# Patient Record
Sex: Male | Born: 1975 | Race: White | Hispanic: No | Marital: Single | Smoking: Current every day smoker
Health system: Southern US, Community
[De-identification: ages and names within clinical notes are randomized; demographics above are authoritative.]

## PROBLEM LIST (undated history)

## (undated) DIAGNOSIS — I10 Essential (primary) hypertension: Secondary | ICD-10-CM

---

## 2009-08-26 ENCOUNTER — Emergency Department (HOSPITAL_COMMUNITY): Admission: EM | Admit: 2009-08-26 | Discharge: 2009-08-26 | Payer: Self-pay | Admitting: Emergency Medicine

## 2010-01-08 ENCOUNTER — Emergency Department (HOSPITAL_COMMUNITY): Admission: EM | Admit: 2010-01-08 | Discharge: 2010-01-08 | Payer: Self-pay | Admitting: Emergency Medicine

## 2013-10-07 ENCOUNTER — Emergency Department (HOSPITAL_COMMUNITY)
Admission: EM | Admit: 2013-10-07 | Discharge: 2013-10-07 | Disposition: A | Discharge status: Home / Self Care | Payer: Self-pay | Attending: Emergency Medicine | Admitting: Emergency Medicine

## 2013-10-07 ENCOUNTER — Encounter (HOSPITAL_COMMUNITY): Payer: Self-pay | Admitting: Emergency Medicine

## 2013-10-07 DIAGNOSIS — F17200 Nicotine dependence, unspecified, uncomplicated: Secondary | ICD-10-CM | POA: Insufficient documentation

## 2013-10-07 DIAGNOSIS — R112 Nausea with vomiting, unspecified: Secondary | ICD-10-CM | POA: Insufficient documentation

## 2013-10-07 DIAGNOSIS — R197 Diarrhea, unspecified: Secondary | ICD-10-CM | POA: Insufficient documentation

## 2013-10-07 DIAGNOSIS — R748 Abnormal levels of other serum enzymes: Secondary | ICD-10-CM | POA: Insufficient documentation

## 2013-10-07 DIAGNOSIS — I10 Essential (primary) hypertension: Secondary | ICD-10-CM | POA: Insufficient documentation

## 2013-10-07 DIAGNOSIS — R945 Abnormal results of liver function studies: Secondary | ICD-10-CM

## 2013-10-07 DIAGNOSIS — R7989 Other specified abnormal findings of blood chemistry: Secondary | ICD-10-CM | POA: Insufficient documentation

## 2013-10-07 DIAGNOSIS — Z79899 Other long term (current) drug therapy: Secondary | ICD-10-CM | POA: Insufficient documentation

## 2013-10-07 DIAGNOSIS — R7309 Other abnormal glucose: Secondary | ICD-10-CM | POA: Insufficient documentation

## 2013-10-07 HISTORY — DX: Essential (primary) hypertension: I10

## 2013-10-07 LAB — CBC WITH DIFFERENTIAL/PLATELET
BASOS PCT: 0 % (ref 0–1)
Basophils Absolute: 0 10*3/uL (ref 0.0–0.1)
Eosinophils Absolute: 0.2 10*3/uL (ref 0.0–0.7)
Eosinophils Relative: 5 % (ref 0–5)
HEMATOCRIT: 42.8 % (ref 39.0–52.0)
Hemoglobin: 15.2 g/dL (ref 13.0–17.0)
LYMPHS PCT: 23 % (ref 12–46)
Lymphs Abs: 1.1 10*3/uL (ref 0.7–4.0)
MCH: 32.8 pg (ref 26.0–34.0)
MCHC: 35.5 g/dL (ref 30.0–36.0)
MCV: 92.4 fL (ref 78.0–100.0)
MONOS PCT: 8 % (ref 3–12)
Monocytes Absolute: 0.4 10*3/uL (ref 0.1–1.0)
NEUTROS ABS: 3 10*3/uL (ref 1.7–7.7)
NEUTROS PCT: 64 % (ref 43–77)
Platelets: 157 10*3/uL (ref 150–400)
RBC: 4.63 MIL/uL (ref 4.22–5.81)
RDW: 12.7 % (ref 11.5–15.5)
WBC: 4.8 10*3/uL (ref 4.0–10.5)

## 2013-10-07 LAB — COMPREHENSIVE METABOLIC PANEL
ALBUMIN: 3.8 g/dL (ref 3.5–5.2)
ALK PHOS: 62 U/L (ref 39–117)
ALT: 62 U/L — ABNORMAL HIGH (ref 0–53)
AST: 156 U/L — AB (ref 0–37)
Anion gap: 11 (ref 5–15)
BILIRUBIN TOTAL: 1.1 mg/dL (ref 0.3–1.2)
BUN: 10 mg/dL (ref 6–23)
CO2: 29 meq/L (ref 19–32)
Calcium: 8.9 mg/dL (ref 8.4–10.5)
Chloride: 100 mEq/L (ref 96–112)
Creatinine, Ser: 0.85 mg/dL (ref 0.50–1.35)
GFR calc Af Amer: >90 mL/min (ref >90)
Glucose, Bld: 221 mg/dL — ABNORMAL HIGH (ref 70–99)
POTASSIUM: 3.7 meq/L (ref 3.7–5.3)
Sodium: 140 mEq/L (ref 137–147)
Total Protein: 6.5 g/dL (ref 6.0–8.3)

## 2013-10-07 LAB — LIPASE, BLOOD: Lipase: 25 U/L (ref 11–59)

## 2013-10-07 MED ORDER — TRAMADOL HCL 50 MG PO TABS: 50.0000 mg | ORAL_TABLET | Freq: Four times a day (QID) | ORAL | Status: DC | PRN

## 2013-10-07 MED ORDER — SODIUM CHLORIDE 0.9 % IV BOLUS (SEPSIS)
1000.0000 mL | Freq: Once | INTRAVENOUS | Status: AC
  Administered 2013-10-07: 1000 mL via INTRAVENOUS

## 2013-10-07 MED ORDER — PROMETHAZINE HCL 25 MG PO TABS: 25.0000 mg | ORAL_TABLET | Freq: Four times a day (QID) | ORAL | Status: DC | PRN

## 2013-10-07 MED ORDER — ONDANSETRON HCL 4 MG/2ML IJ SOLN
4.0000 mg | Freq: Once | INTRAMUSCULAR | Status: AC
  Administered 2013-10-07: 4 mg via INTRAVENOUS

## 2013-10-07 MED ORDER — ONDANSETRON HCL 4 MG/2ML IJ SOLN
4.0000 mg | Freq: Once | INTRAMUSCULAR | Status: DC
  Filled 2013-10-07: qty 2

## 2013-10-07 NOTE — ED Notes (Signed)
To ED via private vehicle with c/o nausea/vomiting/diarrhea that started on Friday evening. Unable to keep anything down, states is throwing up greenish liquid at present

## 2013-10-07 NOTE — Discharge Instructions (Signed)
Viral Gastroenteritis °Viral gastroenteritis is also known as stomach flu. This condition affects the stomach and intestinal tract. It can cause sudden diarrhea and vomiting. The illness typically lasts 3 to 8 days. Most people develop an immune response that eventually gets rid of the virus. While this natural response develops, the virus can make you quite ill. °CAUSES  °Many different viruses can cause gastroenteritis, such as rotavirus or noroviruses. You can catch one of these viruses by consuming contaminated food or water. You may also catch a virus by sharing utensils or other personal items with an infected person or by touching a contaminated surface. °SYMPTOMS  °The most common symptoms are diarrhea and vomiting. These problems can cause a severe loss of body fluids (dehydration) and a body salt (electrolyte) imbalance. Other symptoms may include: °· Fever. °· Headache. °· Fatigue. °· Abdominal pain. °DIAGNOSIS  °Your caregiver can usually diagnose viral gastroenteritis based on your symptoms and a physical exam. A stool sample may also be taken to test for the presence of viruses or other infections. °TREATMENT  °This illness typically goes away on its own. Treatments are aimed at rehydration. The most serious cases of viral gastroenteritis involve vomiting so severely that you are not able to keep fluids down. In these cases, fluids must be given through an intravenous line (IV). °HOME CARE INSTRUCTIONS  °· Drink enough fluids to keep your urine clear or pale yellow. Drink small amounts of fluids frequently and increase the amounts as tolerated. °· Ask your caregiver for specific rehydration instructions. °· Avoid: °¨ Foods high in sugar. °¨ Alcohol. °¨ Carbonated drinks. °¨ Tobacco. °¨ Juice. °¨ Caffeine drinks. °¨ Extremely hot or cold fluids. °¨ Fatty, greasy foods. °¨ Too much intake of anything at one time. °¨ Dairy products until 24 to 48 hours after diarrhea stops. °· You may consume probiotics.  Probiotics are active cultures of beneficial bacteria. They may lessen the amount and number of diarrheal stools in adults. Probiotics can be found in yogurt with active cultures and in supplements. °· Wash your hands well to avoid spreading the virus. °· Only take over-the-counter or prescription medicines for pain, discomfort, or fever as directed by your caregiver. Do not give aspirin to children. Antidiarrheal medicines are not recommended. °· Ask your caregiver if you should continue to take your regular prescribed and over-the-counter medicines. °· Keep all follow-up appointments as directed by your caregiver. °SEEK IMMEDIATE MEDICAL CARE IF:  °· You are unable to keep fluids down. °· You do not urinate at least once every 6 to 8 hours. °· You develop shortness of breath. °· You notice blood in your stool or vomit. This may look like coffee grounds. °· You have abdominal pain that increases or is concentrated in one small area (localized). °· You have persistent vomiting or diarrhea. °· You have a fever. °· The patient is a child younger than 3 months, and he or she has a fever. °· The patient is a child older than 3 months, and he or she has a fever and persistent symptoms. °· The patient is a child older than 3 months, and he or she has a fever and symptoms suddenly get worse. °· The patient is a baby, and he or she has no tears when crying. °MAKE SURE YOU:  °· Understand these instructions. °· Will watch your condition. °· Will get help right away if you are not doing well or get worse. °Document Released: 02/15/2005 Document Revised: 05/10/2011 Document Reviewed: 12/02/2010 °  [employer] Patient Information 2015 [place], [place]. This information is not intended to replace advice given to you by your health care provider. Make sure you discuss any questions you have with your health care provider. Hyperglycemia Hyperglycemia occurs when the glucose (sugar) in your blood is too high. Hyperglycemia can happen  for many reasons, but it most often happens to people who do not know they have diabetes or are not managing their diabetes properly.  CAUSES  Whether you have diabetes or not, there are other causes of hyperglycemia. Hyperglycemia can occur when you have diabetes, but it can also occur in other situations that you might not be as aware of, such as: Diabetes  If you have diabetes and are having problems controlling your blood glucose, hyperglycemia could occur because of some of the following reasons:  Not following your meal plan.  Not taking your diabetes medications or not taking it properly.  Exercising less or doing less activity than you normally do.  Being sick. Pre-diabetes  This cannot be ignored. Before people develop Type 2 diabetes, they almost always have "pre-diabetes." This is when your blood glucose levels are higher than normal, but not yet high enough to be diagnosed as diabetes. Research has shown that some long-term damage to the body, especially the heart and circulatory system, may already be occurring during pre-diabetes. If you take action to manage your blood glucose when you have pre-diabetes, you may delay or prevent Type 2 diabetes from developing. Stress  If you have diabetes, you may be "diet" controlled or on oral medications or insulin to control your diabetes. However, you may find that your blood glucose is higher than usual in the hospital whether you have diabetes or not. This is often referred to as "stress hyperglycemia." Stress can elevate your blood glucose. This happens because of hormones put out by the body during times of stress. If stress has been the cause of your high blood glucose, it can be followed regularly by your caregiver. That way he/she can make sure your hyperglycemia does not continue to get worse or progress to diabetes. Steroids  Steroids are medications that act on the infection fighting system (immune system) to block inflammation or  infection. One side effect can be a rise in blood glucose. Most people can produce enough extra insulin to allow for this rise, but for those who cannot, steroids make blood glucose levels go even higher. It is not unusual for steroid treatments to "uncover" diabetes that is developing. It is not always possible to determine if the hyperglycemia will go away after the steroids are stopped. A special blood test called an A1c is sometimes done to determine if your blood glucose was elevated before the steroids were started. SYMPTOMS  Thirsty.  Frequent urination.  Dry mouth.  Blurred vision.  Tired or fatigue.  Weakness.  Sleepy.  Tingling in feet or leg. DIAGNOSIS  Diagnosis is made by monitoring blood glucose in one or all of the following ways:  A1c test. This is a chemical found in your blood.  Fingerstick blood glucose monitoring.  Laboratory results. TREATMENT  First, knowing the cause of the hyperglycemia is important before the hyperglycemia can be treated. Treatment may include, but is not be limited to:  Education.  Change or adjustment in medications.  Change or adjustment in meal plan.  Treatment for an illness, infection, etc.  More frequent blood glucose monitoring.  Change in exercise plan.  Decreasing or stopping steroids.  Lifestyle changes. HOME  CARE INSTRUCTIONS   Test your blood glucose as directed.  Exercise regularly. Your caregiver will give you instructions about exercise. Pre-diabetes or diabetes which comes on with stress is helped by exercising.  Eat wholesome, balanced meals. Eat often and at regular, fixed times. Your caregiver or nutritionist will give you a meal plan to guide your sugar intake.  Being at an ideal weight is important. If needed, losing as little as 10 to 15 pounds may help improve blood glucose levels. SEEK MEDICAL CARE IF:   You have questions about medicine, activity, or diet.  You continue to have symptoms  (problems such as increased thirst, urination, or weight gain). SEEK IMMEDIATE MEDICAL CARE IF:   You are vomiting or have diarrhea.  Your breath smells fruity.  You are breathing faster or slower.  You are very sleepy or incoherent.  You have numbness, tingling, or pain in your feet or hands.  You have chest pain.  Your symptoms get worse even though you have been following your caregiver's orders.  If you have any other questions or concerns. Document Released: 08/11/2000 Document Revised: 05/10/2011 Document Reviewed: 06/14/2011 [facility] [place] Patient Information 2015 [place], [place]. This information is not intended to replace advice given to you by your health care provider. Make sure you discuss any questions you have with your health care provider.

## 2013-10-07 NOTE — ED Provider Notes (Signed)
CSN: [id number]     Arrival date & time 10/07/13  [id number] History   First MD Initiated Contact with Patient 10/07/13 1003     Chief Complaint  Patient presents with  . Nausea  . Emesis  . Diarrhea     (Consider location/radiation/quality/duration/timing/severity/associated sxs/prior Treatment) HPI  Patient presents to the ED for evaluation of n/v/d that started Friday night. It has continued to this morning and he not longer feels like he can eat or drink without throwing it back up. The diarrhea is watery and the vomit was whatever food or drink he had but is now bile. He has not had pain but some abdominal cramping and leg cramps. He denies having any fever, pmh of abd surgeries, constipation, chest pains, weakness, fatigue, diaphoresis. Admits to drinking a fair amount of alcohol. Currently doing the [place] diet (no alcohol or sugars for the past 5 days, changed his diet abruptly).   Past Medical History  Diagnosis Date  . Hypertension    History reviewed. No pertinent past surgical history. No family history on file. History  Substance Use Topics  . Smoking status: Current Every Day Smoker -- 0.50 packs/day  . Smokeless tobacco: Not on file  . Alcohol Use: Yes     Comment: occasionally    Review of Systems   Review of Systems  Gen: no weight loss, fevers, chills, night sweats  Eyes: no discharge or drainage, no occular pain or visual changes  Nose: no epistaxis or rhinorrhea  Mouth: no dental pain, no sore throat  Neck: no neck pain  Lungs:No wheezing or hemoptysis No coughing CV:  No palpitations, dependent edema or orthopnea. No chest pain Abd: + diarrhea, nausea or vomiting, No abdominal pain  GU: no dysuria or gross hematuria  MSK:  No muscle weakness, No  pain Neuro: no headache, no focal neurologic deficits  Skin: no rash , no wounds Psyche: no complaints    Allergies  Review of patient's allergies indicates no known allergies.  Home Medications    Prior to Admission medications   Medication Sig Start Date End Date Taking? Authorizing Provider  ibuprofen (ADVIL,MOTRIN) 200 MG tablet Take 600 mg by mouth every 6 (six) hours as needed for mild pain.   Yes Historical Provider, MD  promethazine (PHENERGAN) 25 MG tablet Take 1 tablet (25 mg total) by mouth every 6 (six) hours as needed for nausea or vomiting. 10/07/13   Dorthula Matasiffany G Juda Lajeunesse, PA-C  traMADol (ULTRAM) 50 MG tablet Take 1 tablet (50 mg total) by mouth every 6 (six) hours as needed. 10/07/13   Nakeysha Pasqual Irine SealG Yumiko Alkins, PA-C   BP 143/89  Pulse 74  Temp(Src) 98.1 F (36.7 C) (Oral)  Resp 13  Ht 6' (1.829 m)  Wt 245 lb (111.131 kg)  BMI 33.22 kg/m2  SpO2 99% Physical Exam  Nursing note and vitals reviewed. Constitutional: He appears well-developed and well-nourished. No distress.  HENT:  Head: Normocephalic and atraumatic.  Eyes: Pupils are equal, round, and reactive to light.  Neck: Normal range of motion. Neck supple.  Cardiovascular: Normal rate and regular rhythm.   Pulmonary/Chest: Effort normal.  Abdominal: Soft. Bowel sounds are normal. He exhibits no distension, no fluid wave and no ascites. There is no tenderness. There is no rigidity, no guarding and no CVA tenderness. No hernia.  Neurological: He is alert.  Skin: Skin is warm and dry.      ED Course  Procedures (including critical care time) Labs Review Labs Reviewed  COMPREHENSIVE METABOLIC PANEL - Abnormal; Notable for the following:    Glucose, Bld 221 (*)    AST 156 (*)    ALT 62 (*)    All other components within normal limits  CBC WITH DIFFERENTIAL  LIPASE, BLOOD    Imaging Review No results found.   EKG Interpretation None      MDM   Final diagnoses:  Nausea vomiting and diarrhea  Elevated liver function tests  Elevated glucose    Medications  ondansetron (ZOFRAN) injection 4 mg (4 mg Intravenous Given 10/07/13 1149)  sodium chloride 0.9 % bolus 1,000 mL (1,000 mLs Intravenous New Bag/Given  10/07/13 1148)    He reports feeling 100% better after the fluids and the Zofran. On re-eval of his belly, he is still soft and non tender. We discussed his elevated LFTs and his elevated glucose. He has been advised to have his sugar rechecked by a PCP as well as his liver enzymes/ liver panel. He admits to drinking alcohol and has recently quit, no RUQ pain.  Promethazine (PHENERGAN) 25 MG tablet Take 1 tablet (25 mg total) by mouth every 6 (six) hours as needed for nausea or vomiting. 30 tablet Dorthula Matas, PA-C   TraMADol (ULTRAM) 50 MG tablet Take 1 tablet (50 mg total) by mouth every 6 (six) hours as needed. 15 tablet Dorthula Matas, PA-C  38 y.o.Alejandro Santiago's evaluation in the Emergency Department is complete. It has been determined that no acute conditions requiring further emergency intervention are present at this time. The patient/guardian have been advised of the diagnosis and plan. We have discussed signs and symptoms that warrant return to the ED, such as changes or worsening in symptoms.  Vital signs are stable at discharge. Filed Vitals:   10/07/13 1200  BP: 143/89  Pulse: 74  Temp:   Resp: 13    Patient/guardian has voiced understanding and agreed to follow-up with the PCP or specialist.   Dorthula Matas, PA-C 10/07/13 1353

## 2013-10-11 NOTE — ED Provider Notes (Signed)
Medical screening examination/treatment/procedure(s) were performed by non-physician practitioner and as supervising physician I was immediately available for consultation/collaboration.   EKG Interpretation None       Estefan Pattison, MD 10/11/13 0635 

## 2014-08-01 ENCOUNTER — Encounter (HOSPITAL_COMMUNITY): Payer: Self-pay | Admitting: Emergency Medicine

## 2014-08-01 ENCOUNTER — Emergency Department (HOSPITAL_COMMUNITY): Payer: Self-pay

## 2014-08-01 ENCOUNTER — Emergency Department (HOSPITAL_COMMUNITY)
Admission: EM | Admit: 2014-08-01 | Discharge: 2014-08-01 | Disposition: A | Discharge status: Home / Self Care | Payer: Self-pay | Attending: Emergency Medicine | Admitting: Emergency Medicine

## 2014-08-01 DIAGNOSIS — Y9289 Other specified places as the place of occurrence of the external cause: Secondary | ICD-10-CM | POA: Insufficient documentation

## 2014-08-01 DIAGNOSIS — I10 Essential (primary) hypertension: Secondary | ICD-10-CM | POA: Insufficient documentation

## 2014-08-01 DIAGNOSIS — S82831A Other fracture of upper and lower end of right fibula, initial encounter for closed fracture: Secondary | ICD-10-CM | POA: Insufficient documentation

## 2014-08-01 DIAGNOSIS — S90121A Contusion of right lesser toe(s) without damage to nail, initial encounter: Secondary | ICD-10-CM | POA: Insufficient documentation

## 2014-08-01 DIAGNOSIS — Y9389 Activity, other specified: Secondary | ICD-10-CM | POA: Insufficient documentation

## 2014-08-01 DIAGNOSIS — R269 Unspecified abnormalities of gait and mobility: Secondary | ICD-10-CM | POA: Insufficient documentation

## 2014-08-01 DIAGNOSIS — X58XXXA Exposure to other specified factors, initial encounter: Secondary | ICD-10-CM | POA: Insufficient documentation

## 2014-08-01 DIAGNOSIS — Z720 Tobacco use: Secondary | ICD-10-CM | POA: Insufficient documentation

## 2014-08-01 DIAGNOSIS — Y998 Other external cause status: Secondary | ICD-10-CM | POA: Insufficient documentation

## 2014-08-01 DIAGNOSIS — Z79899 Other long term (current) drug therapy: Secondary | ICD-10-CM | POA: Insufficient documentation

## 2014-08-01 DIAGNOSIS — S9001XA Contusion of right ankle, initial encounter: Secondary | ICD-10-CM | POA: Insufficient documentation

## 2014-08-01 MED ORDER — HYDROCODONE-ACETAMINOPHEN 5-325 MG PO TABS: 1.0000 | ORAL_TABLET | Freq: Four times a day (QID) | ORAL | Status: DC | PRN

## 2014-08-01 MED ORDER — HYDROCODONE-ACETAMINOPHEN 5-325 MG PO TABS
2.0000 | ORAL_TABLET | Freq: Once | ORAL | Status: AC
  Administered 2014-08-01: 2 via ORAL
  Filled 2014-08-01: qty 2

## 2014-08-01 MED ORDER — NAPROXEN 500 MG PO TABS: 500.0000 mg | ORAL_TABLET | Freq: Two times a day (BID) | ORAL | Status: DC

## 2014-08-01 MED ORDER — ONDANSETRON 4 MG PO TBDP
4.0000 mg | ORAL_TABLET | Freq: Once | ORAL | Status: AC
  Administered 2014-08-01: 4 mg via ORAL
  Filled 2014-08-01: qty 1

## 2014-08-01 NOTE — Discharge Instructions (Signed)
Ankle Fracture  A fracture is a break in a bone. The ankle joint is made up of three bones. These include the lower (distal)sections of your lower leg bones, called the tibia and fibula, along with a bone in your foot, called the talus. Depending on how bad the break is and if more than one ankle joint bone is broken, a cast or splint is used to protect and keep your injured bone from moving while it heals. Sometimes, surgery is required to help the fracture heal properly.   There are two general types of fractures:   Stable fracture. This includes a single fracture line through one bone, with no injury to ankle ligaments. A fracture of the talus that does not have any displacement (movement of the bone on either side of the fracture line) is also stable.   Unstable fracture. This includes more than one fracture line through one or more bones in the ankle joint. It also includes fractures that have displacement of the bone on either side of the fracture line.  CAUSES   A direct blow to the ankle.    Quickly and severely twisting your ankle.   Trauma, such as a car accident or falling from a significant height.  RISK FACTORS  You may be at a higher risk of ankle fracture if:   You have certain medical conditions.   You are involved in high-impact sports.   You are involved in a high-impact car accident.  SIGNS AND SYMPTOMS    Tender and swollen ankle.   Bruising around the injured ankle.   Pain on movement of the ankle.   Difficulty walking or putting weight on the ankle.   A cold foot below the site of the ankle injury. This can occur if the blood vessels passing through your injured ankle were also damaged.   Numbness in the foot below the site of the ankle injury.  DIAGNOSIS   An ankle fracture is usually diagnosed with a physical exam and X-rays. A CT scan may also be required for complex fractures.  TREATMENT   Stable fractures are treated with a cast or splint and using crutches to avoid putting  weight on your injured ankle. This is followed by an ankle strengthening program. Some patients require a special type of cast, depending on other medical problems they may have. Unstable fractures require surgery to ensure the bones heal properly. Your health care provider will tell you what type of fracture you have and the best treatment for your condition.  HOME CARE INSTRUCTIONS    Review correct crutch use with your health care provider and use your crutches as directed. Safe use of crutches is extremely important. Misuse of crutches can cause you to fall or cause injury to nerves in your hands or armpits.   Do not put weight or pressure on the injured ankle until directed by your health care provider.   To lessen the swelling, keep the injured leg elevated while sitting or lying down.   Apply ice to the injured area:   Put ice in a plastic bag.   Place a towel between your cast and the bag.   Leave the ice on for 20 minutes, 2-3 times a day.   If you have a plaster or fiberglass cast:   Do not try to scratch the skin under the cast with any objects. This can increase your risk of skin infection.   Check the skin around the cast every day. You   may put lotion on any red or sore areas.   Keep your cast dry and clean.   If you have a plaster splint:   Wear the splint as directed.   You may loosen the elastic around the splint if your toes become numb, tingle, or turn cold or blue.   Do not put pressure on any part of your cast or splint; it may break. Rest your cast only on a pillow the first 24 hours until it is fully hardened.   Your cast or splint can be protected during bathing with a plastic bag sealed to your skin with medical tape. Do not lower the cast or splint into water.   Take medicines as directed by your health care provider. Only take over-the-counter or prescription medicines for pain, discomfort, or fever as directed by your health care provider.   Do not drive a vehicle until  your health care provider specifically tells you it is safe to do so.   If your health care provider has given you a follow-up appointment, it is very important to keep that appointment. Not keeping the appointment could result in a chronic or permanent injury, pain, and disability. If you have any problem keeping the appointment, call the facility for assistance.  SEEK MEDICAL CARE IF:  You develop increased swelling or discomfort.  SEEK IMMEDIATE MEDICAL CARE IF:    Your cast gets damaged or breaks.   You have continued severe pain.   You develop new pain or swelling after the cast was put on.   Your skin or toenails below the injury turn blue or gray.   Your skin or toenails below the injury feel cold, numb, or have loss of sensitivity to touch.   There is a bad smell or pus draining from under the cast.  MAKE SURE YOU:    Understand these instructions.   Will watch your condition.   Will get help right away if you are not doing well or get worse.  Document Released: 02/13/2000 Document Revised: 02/20/2013 Document Reviewed: 09/14/2012  [employer] Patient Information 2015 [employer]. This information is not intended to replace advice given to you by your health care provider. Make sure you discuss any questions you have with your health care provider.

## 2014-08-01 NOTE — ED Provider Notes (Signed)
CSN: [id number]     Arrival date & time 08/01/14  1652 History  This chart was scribed for Arthor CaptainAbigail Shanetta Nicolls, PA-C, working with Eber HongBrian Miller, MD by Octavia HeirArianna Nassar, ED Scribe. This patient was seen in room WTR7/WTR7 and the patient's care was started at 5:46 PM.    Chief Complaint  Patient presents with  . Foot Injury    The history is provided by the patient. No language interpreter was used.    HPI Comments: Alejandro Santiago is a 39 y.o. male who presents to the Emergency Department complaining of a right ankle and foot injury that occurred yesterday morning. Pt is unsure of what caused his injury but believes he twisted his foot backwards underneath his weight. Pt says that his toes have minimal sensation and are tingling. Pt notes that applying pressure while ambulating causes more pain.    Past Medical History  Diagnosis Date  . Hypertension    No past surgical history on file. No family history on file. History  Substance Use Topics  . Smoking status: Current Every Day Smoker -- 0.50 packs/day  . Smokeless tobacco: Not on file  . Alcohol Use: Yes     Comment: occasionally    Review of Systems  Musculoskeletal: Positive for joint swelling and gait problem.  Skin: Positive for color change. Negative for wound.      Allergies  Review of patient's allergies indicates no known allergies.  Home Medications   Prior to Admission medications   Medication Sig Start Date End Date Taking? Authorizing Provider  ibuprofen (ADVIL,MOTRIN) 200 MG tablet Take 600 mg by mouth every 6 (six) hours as needed for mild pain.    Historical Provider, MD  promethazine (PHENERGAN) 25 MG tablet Take 1 tablet (25 mg total) by mouth every 6 (six) hours as needed for nausea or vomiting. 10/07/13   Marlon Peliffany Greene, PA-C  traMADol (ULTRAM) 50 MG tablet Take 1 tablet (50 mg total) by mouth every 6 (six) hours as needed. 10/07/13   Marlon Peliffany Greene, PA-C   Triage vitals: BP 162/79 mmHg  Pulse 112  Temp(Src)  98.8 F (37.1 C) (Oral)  Resp 18  SpO2 96% Physical Exam  Constitutional: He is oriented to person, place, and time. He appears well-developed and well-nourished. No distress.  HENT:  Head: Normocephalic.  Eyes: Conjunctivae are normal. Pupils are equal, round, and reactive to light. No scleral icterus.  Neck: Normal range of motion. Neck supple. No thyromegaly present.  Cardiovascular: Normal rate and regular rhythm.  Exam reveals no gallop and no friction rub.   No murmur heard. Pulmonary/Chest: Effort normal and breath sounds normal. No respiratory distress. He has no wheezes. He has no rales.  Abdominal: Soft. Bowel sounds are normal. He exhibits no distension. There is no tenderness. There is no rebound.  Musculoskeletal: Normal range of motion.  Neurological: He is alert and oriented to person, place, and time.  Skin: Skin is warm and dry. No rash noted.  Swelling and bruising all over toes and ankle Tenderness from distal portion of later tibia  Psychiatric: He has a normal mood and affect. His behavior is normal.    ED Course  Procedures  DIAGNOSTIC STUDIES: Oxygen Saturation is 96% on RA, normal by my interpretation.  COORDINATION OF CARE:  5:52 PM Discussed treatment plan which includes pain medication and patient declined.  Labs Review Labs Reviewed - No data to display  Imaging Review Dg Ankle Complete Right  08/01/2014   CLINICAL DATA:  Ankle pain  status post fall.  EXAM: RIGHT ANKLE - COMPLETE 3+ VIEW  COMPARISON:  None.  FINDINGS: There is an oblique nondisplaced fracture of the distal fibula extending proximally from the ankle joint by approximately 3.6 cm. There is no widening of the ankle mortise. The distal tibia is intact. There is no subluxation of the talus or associated osteochondral lesion. Moderate lateral soft tissue swelling noted.  IMPRESSION: Oblique nondisplaced fracture of the distal fibula.   Electronically Signed   By: Carey Bullocks M.D.   On:  08/01/2014 17:53   Dg Foot Complete Right  08/01/2014   CLINICAL DATA:  Cleaning hot tub and slipped and fell bending right foot backwards  EXAM: RIGHT FOOT COMPLETE - 3+ VIEW  COMPARISON:  None.  FINDINGS: Obliquely oriented fracture involving the distal fibula noted. No significant displacement of the fracture fragments. There is no evidence of arthropathy or other focal bone abnormality. Soft tissues are unremarkable.  IMPRESSION: Acute fracture involves the distal fibula.   Electronically Signed   By: Signa Kell M.D.   On: 08/01/2014 17:56     EKG Interpretation None      MDM   Final diagnoses:  Fracture of distal end of right fibula    Patient with fracture of the distal fibula. Placed in Cam walker. NWB with crutches and ortho follow up . Pain meds, RICE. NVI Appears safe for discharge at this time  I personally performed the services described in this documentation, which was scribed in my presence. The recorded information has been reviewed and is accurate.      Arthor Captain, PA-C 08/01/14 2050  Eber Hong, MD 08/02/14 [phone]

## 2014-08-01 NOTE — ED Notes (Signed)
Pt c/o right foot pain, ecchymosis, and non-pitting edema after catching and twisting his ankle. 2+ pedal pulse palpated, motor function and sensation intact, sensation is decreased to toes. Pt unsure of exact mechanism of injury, although he states he believes that the foot twisted posteriorly, underneath his weight.

## 2015-06-28 ENCOUNTER — Emergency Department (HOSPITAL_COMMUNITY): Payer: Self-pay

## 2015-06-28 ENCOUNTER — Encounter (HOSPITAL_COMMUNITY): Payer: Self-pay | Admitting: *Deleted

## 2015-06-28 ENCOUNTER — Emergency Department (HOSPITAL_COMMUNITY)
Admission: EM | Admit: 2015-06-28 | Discharge: 2015-06-28 | Disposition: A | Discharge status: Home / Self Care | Payer: Self-pay | Attending: Emergency Medicine | Admitting: Emergency Medicine

## 2015-06-28 DIAGNOSIS — W010XXA Fall on same level from slipping, tripping and stumbling without subsequent striking against object, initial encounter: Secondary | ICD-10-CM | POA: Insufficient documentation

## 2015-06-28 DIAGNOSIS — S20212A Contusion of left front wall of thorax, initial encounter: Secondary | ICD-10-CM | POA: Insufficient documentation

## 2015-06-28 DIAGNOSIS — Z791 Long term (current) use of non-steroidal anti-inflammatories (NSAID): Secondary | ICD-10-CM | POA: Insufficient documentation

## 2015-06-28 DIAGNOSIS — T1490XA Injury, unspecified, initial encounter: Secondary | ICD-10-CM

## 2015-06-28 DIAGNOSIS — F17210 Nicotine dependence, cigarettes, uncomplicated: Secondary | ICD-10-CM | POA: Insufficient documentation

## 2015-06-28 DIAGNOSIS — Y929 Unspecified place or not applicable: Secondary | ICD-10-CM | POA: Insufficient documentation

## 2015-06-28 DIAGNOSIS — Y939 Activity, unspecified: Secondary | ICD-10-CM | POA: Insufficient documentation

## 2015-06-28 DIAGNOSIS — Y999 Unspecified external cause status: Secondary | ICD-10-CM | POA: Insufficient documentation

## 2015-06-28 DIAGNOSIS — I10 Essential (primary) hypertension: Secondary | ICD-10-CM | POA: Insufficient documentation

## 2015-06-28 MED ORDER — HYDROCODONE-ACETAMINOPHEN 5-325 MG PO TABS: 1.0000 | ORAL_TABLET | ORAL | Status: DC | PRN

## 2015-06-28 MED ORDER — NAPROXEN 500 MG PO TABS: 500.0000 mg | ORAL_TABLET | Freq: Two times a day (BID) | ORAL | Status: DC

## 2015-06-28 NOTE — ED Provider Notes (Signed)
CSN: [id number]     Arrival date & time 06/28/15  1919 History  By signing my name below, I, Alejandro Santiago, attest that this documentation has been prepared under the direction and in the presence of TRW AutomotiveKelly Shiheem Corporan, PA-C. Electronically Signed: Phillis HaggisGabriella Santiago, ED Scribe. 06/28/2015. 8:20 PM.   Chief Complaint  Patient presents with  . Rib Injury   The history is provided by the patient. No language interpreter was used.   HPI Comments: Alejandro Santiago is a 40 y.o. Male with a hx of HTN and broken ribs who presents to the Emergency Department complaining of gradually resolving left rib cage pain onset one day ago. Pt states that he slipped on a skateboard on stairs then fell down some of the stairs. He reports posterior left rib pain and SOB, which he describes as "fluttering in his lungs." He reports worsening pain with coughing and deep breathing. He has taken ibuprofen for pain to mild relief. He states that this pain feels similar to his broken rib pain in the past. He denies fever, hemoptysis, nausea, vomiting, or LOC.   Past Medical History  Diagnosis Date  . Hypertension    History reviewed. No pertinent past surgical history. No family history on file. Social History  Substance Use Topics  . Smoking status: Current Every Day Smoker -- 0.50 packs/day    Types: Cigarettes  . Smokeless tobacco: None  . Alcohol Use: Yes     Comment: occasionally    Review of Systems  Constitutional: Negative for fever.  Respiratory: Positive for shortness of breath.   Cardiovascular: Positive for chest pain (left rib cage).  Gastrointestinal: Negative for nausea and vomiting.  Neurological: Negative for syncope.  All other systems reviewed and are negative.  Allergies  Review of patient's allergies indicates no known allergies.  Home Medications   Prior to Admission medications   Medication Sig Start Date End Date Taking? Authorizing Provider  ibuprofen (ADVIL,MOTRIN) 200 MG tablet Take 600  mg by mouth every 6 (six) hours as needed for mild pain or moderate pain (pain).    Yes Historical Provider, MD   BP 164/98 mmHg  Pulse 101  Temp(Src) 98.4 F (36.9 C) (Oral)  Resp 20  Ht 6' (1.829 m)  Wt 230 lb (104.327 kg)  BMI 31.19 kg/m2  SpO2 95%   Physical Exam  Constitutional: He is oriented to person, place, and time. He appears well-developed and well-nourished. No distress.  Patient calm and well-appearing, in no distress.  HENT:  Head: Normocephalic and atraumatic.  Eyes: Conjunctivae and EOM are normal. No scleral icterus.  Neck: Normal range of motion.  Cardiovascular: Normal rate, regular rhythm and intact distal pulses.   Pulmonary/Chest: Effort normal and breath sounds normal. No respiratory distress. He has no wheezes. He has no rales. He exhibits tenderness. He exhibits no crepitus, no deformity and no retraction.    Lungs clear to auscultation bilaterally. Chest expansion symmetric. There is tenderness to palpation of the anterior left chest wall, just anterior to the left anterior axillary line. No crepitus noted. No deformities.  Musculoskeletal: Normal range of motion.  Neurological: He is alert and oriented to person, place, and time. He exhibits normal muscle tone. Coordination normal.  Skin: Skin is warm and dry. No rash noted. He is not diaphoretic. No erythema. No pallor.  Psychiatric: He has a normal mood and affect. His behavior is normal.  Nursing note and vitals reviewed.   ED Course  Procedures (including critical care time) DIAGNOSTIC  STUDIES: Oxygen Saturation is 95% on RA, normal by my interpretation.    COORDINATION OF CARE: 8:19 PM-Discussed treatment plan which includes Naproxen, hydrocodone, and x-ray with pt at bedside and pt agreed to plan.    Labs Review Labs Reviewed - No data to display  Imaging Review Dg Ribs Unilateral W/chest Left  06/28/2015  CLINICAL DATA:  Tripped on skateboard. EXAM: LEFT RIBS AND CHEST - 3+ VIEW  COMPARISON:  None FINDINGS: No fracture or other bone lesions are seen involving the ribs. Chronic appearing deformity involving the left clavicle noted. There is no evidence of pneumothorax or pleural effusion. Both lungs are clear. Heart size and mediastinal contours are within normal limits. IMPRESSION: 1. No displaced rib fractures identified. 2. Left clavicle fracture, likely chronic. Electronically Signed   By: Signa Kell M.D.   On: 06/28/2015 20:13   I have personally reviewed and evaluated these images as part of my medical decision-making.   EKG Interpretation None      MDM   Final diagnoses:  Chest wall contusion, left, initial encounter    40 year old male presents to the emergency department for evaluation of left-sided chest wall pain after a mechanical fall secondary to slipping on a skateboard. No head trauma or LOC. Injury occurred yesterday evening. Patient is well and nontoxic appearing. He has tenderness that is reproducible to his chest wall without crepitus or deformity. Lung sounds equal bilaterally and clear. X-ray shows no evidence of rib fracture. No pneumothorax. Symptoms consistent with chest wall contusion. Will manage with NSAIDs and icing. Patient discharged with an incentive spirometer to use hourly while awake. Short course of Norco given for pain control as needed. Return precautions discussed and provided. Patient discharged in satisfactory condition with no unaddressed concerns.  I personally performed the services described in this documentation, which was scribed in my presence. The recorded information has been reviewed and is accurate.    Filed Vitals:   06/28/15 1940  BP: 164/98  Pulse: 101  Temp: 98.4 F (36.9 C)  TempSrc: Oral  Resp: 20  Height: 6' (1.829 m)  Weight: 104.327 kg  SpO2: 95%      Antony Madura, PA-C 06/28/15 2035  Azalia Bilis, MD 06/29/15 [phone]

## 2015-06-28 NOTE — Discharge Instructions (Signed)
Your chest x-ray did not show any broken ribs. We recommend that you use an incentive spirometer once per hour while awake to ensure the you're taking deep breaths. Take naproxen as prescribed for pain control. You may take Norco as needed for severe pain. Follow your primary care doctor for a recheck of symptoms. Return as needed if symptoms worsen.  Chest Contusion A chest contusion is a deep bruise on your chest area. Contusions are the result of an injury that caused bleeding under the skin. A chest contusion may involve bruising of the skin, muscles, or ribs. The contusion may turn blue, purple, or yellow. Minor injuries will give you a painless contusion, but more severe contusions may stay painful and swollen for a few weeks. CAUSES  A contusion is usually caused by a blow, trauma, or direct force to an area of the body. SYMPTOMS   Swelling and redness of the injured area.  Discoloration of the injured area.  Tenderness and soreness of the injured area.  Pain. DIAGNOSIS  The diagnosis can be made by taking a history and performing a physical exam. An X-ray, CT scan, or MRI may be needed to determine if there were any associated injuries, such as broken bones (fractures) or internal injuries. TREATMENT  Often, the best treatment for a chest contusion is resting, icing, and applying cold compresses to the injured area. Deep breathing exercises may be recommended to reduce the risk of pneumonia. Over-the-counter medicines may also be recommended for pain control. HOME CARE INSTRUCTIONS   Put ice on the injured area.  Put ice in a plastic bag.  Place a towel between your skin and the bag.  Leave the ice on for 15-20 minutes, 03-04 times a day.  Only take over-the-counter or prescription medicines as directed by your caregiver. Your caregiver may recommend avoiding anti-inflammatory medicines (aspirin, ibuprofen, and naproxen) for 48 hours because these medicines may increase  bruising.  Rest the injured area.  Perform deep-breathing exercises as directed by your caregiver.  Stop smoking if you smoke.  Do not lift objects over 5 pounds (2.3 kg) for 3 days or longer if recommended by your caregiver. SEEK IMMEDIATE MEDICAL CARE IF:   You have increased bruising or swelling.  You have pain that is getting worse.  You have difficulty breathing.  You have dizziness, weakness, or fainting.  You have blood in your urine or stool.  You cough up or vomit blood.  Your swelling or pain is not relieved with medicines. MAKE SURE YOU:   Understand these instructions.  Will watch your condition.  Will get help right away if you are not doing well or get worse.   This information is not intended to replace advice given to you by your health care provider. Make sure you discuss any questions you have with your health care provider.   Document Released: 11/10/2000 Document Revised: 11/10/2011 Document Reviewed: 08/09/2011 Elsevier Interactive Patient Education [employer]2016 Elsevier Inc.

## 2015-06-28 NOTE — ED Notes (Signed)
PT states that he was coming down the stairs last night and a skateboard had been left on the stairs; pt states that he slipped on the skateboard and landed on his right side; pt c/o rt rib pain; pt states that he feel short of breath; pt states that any movement that involves the rt rib cage increases the pain

## 2015-06-28 NOTE — ED Notes (Signed)
Patient is alert and oriented x3.  He was given DC instructions and follow up visit instructions.  Patient gave verbal understanding.  He was DC ambulatory under his own power to home.  V/S stable.  He was not showing any signs of distress on DC 

## 2016-10-09 ENCOUNTER — Emergency Department (HOSPITAL_COMMUNITY): Payer: Self-pay

## 2016-10-09 ENCOUNTER — Encounter (HOSPITAL_COMMUNITY): Payer: Self-pay

## 2016-10-09 ENCOUNTER — Emergency Department (HOSPITAL_COMMUNITY)
Admission: EM | Admit: 2016-10-09 | Discharge: 2016-10-09 | Disposition: A | Discharge status: Home / Self Care | Payer: Self-pay | Attending: Emergency Medicine | Admitting: Emergency Medicine

## 2016-10-09 DIAGNOSIS — F17210 Nicotine dependence, cigarettes, uncomplicated: Secondary | ICD-10-CM | POA: Insufficient documentation

## 2016-10-09 DIAGNOSIS — Y999 Unspecified external cause status: Secondary | ICD-10-CM | POA: Insufficient documentation

## 2016-10-09 DIAGNOSIS — Z79899 Other long term (current) drug therapy: Secondary | ICD-10-CM | POA: Insufficient documentation

## 2016-10-09 DIAGNOSIS — Y929 Unspecified place or not applicable: Secondary | ICD-10-CM | POA: Insufficient documentation

## 2016-10-09 DIAGNOSIS — W268XXA Contact with other sharp object(s), not elsewhere classified, initial encounter: Secondary | ICD-10-CM | POA: Insufficient documentation

## 2016-10-09 DIAGNOSIS — Z23 Encounter for immunization: Secondary | ICD-10-CM | POA: Insufficient documentation

## 2016-10-09 DIAGNOSIS — I10 Essential (primary) hypertension: Secondary | ICD-10-CM | POA: Insufficient documentation

## 2016-10-09 DIAGNOSIS — Y939 Activity, unspecified: Secondary | ICD-10-CM | POA: Insufficient documentation

## 2016-10-09 DIAGNOSIS — S61412A Laceration without foreign body of left hand, initial encounter: Secondary | ICD-10-CM | POA: Insufficient documentation

## 2016-10-09 MED ORDER — TETANUS-DIPHTH-ACELL PERTUSSIS 5-2.5-18.5 LF-MCG/0.5 IM SUSP
0.5000 mL | Freq: Once | INTRAMUSCULAR | Status: AC
  Administered 2016-10-09: 0.5 mL via INTRAMUSCULAR
  Filled 2016-10-09: qty 0.5

## 2016-10-09 MED ORDER — HYDROCODONE-ACETAMINOPHEN 7.5-325 MG/15ML PO SOLN: 10.0000 mL | Freq: Once | ORAL | Status: DC

## 2016-10-09 MED ORDER — HYDROCODONE-ACETAMINOPHEN 5-325 MG PO TABS
1.0000 | ORAL_TABLET | Freq: Once | ORAL | Status: AC
  Administered 2016-10-09: 1 via ORAL
  Filled 2016-10-09: qty 1

## 2016-10-09 MED ORDER — LIDOCAINE-EPINEPHRINE (PF) 2 %-1:200000 IJ SOLN
20.0000 mL | Freq: Once | INTRAMUSCULAR | Status: AC
  Administered 2016-10-09: 20 mL
  Filled 2016-10-09: qty 20

## 2016-10-09 MED ORDER — IBUPROFEN 200 MG PO TABS: 600.0000 mg | ORAL_TABLET | Freq: Three times a day (TID) | ORAL | 0 refills | Status: DC | PRN

## 2016-10-09 NOTE — Discharge Instructions (Signed)
1. Medications: Tylenol or ibuprofen for pain 2. Treatment: ice for swelling, keep wound clean with warm soap and water and keep bandage dry, do not submerge in water for 24 hours 3. Follow Up: Please return in 7-10 days to have your stitches removed or sooner if you have concerns. Return to the emergency department for increased redness, drainage of pus from the wound 4. If you have concerns about your numbness or the function of your thumb, you may follow-up with the hand surgeon.   WOUND CARE  Keep area clean and dry for 24 hours. Do not remove bandage, if applied.  After 24 hours, remove bandage and wash wound gently with mild soap and warm water. Reapply a new bandage after cleaning wound, if directed.   Continue daily cleansing with soap and water until stitches/staples are removed.  Do not apply any ointments or creams to the wound while stitches/staples are in place, as this may cause delayed healing. Return if you experience any of the following signs of infection: Swelling, redness, pus drainage, streaking, fever >101.0 F  Return if you experience excessive bleeding that does not stop after 15-20 minutes of constant, firm pressure.

## 2016-10-09 NOTE — ED Provider Notes (Signed)
[facility]-EMERGENCY DEPT Provider Note   CSN: [id number] Arrival date & time: 10/09/16  1958     History   Chief Complaint Chief Complaint  Patient presents with  . Extremity Laceration    HPI Alejandro Santiago is a 41 y.o. male presenting with lac to the left hand.  Patient states a ceramic plate was falling from the cabinet when it shattered on the granite. He tried to catch it and he was stabbed in the palm by a shard. He does not think there is any remains of the plate in his hand. Since then, he has had increasing pain at the laceration, and gradual numbing of the palmar aspect of his thumb. He has decreased range of motion of the thumb due to pain. He is having no pain or limited range of motion of the other fingers. He is not on blood thinners. He denies injury elsewhere. He has not taken anything for pain. He does not know when his last shot was, and he does not think it was within the past 10 years.  HPI  Past Medical History:  Diagnosis Date  . Hypertension     There are no active problems to display for this patient.   History reviewed. No pertinent surgical history.     Home Medications    Prior to Admission medications   Medication Sig Start Date End Date Taking? Authorizing Provider  HYDROcodone-acetaminophen (NORCO/VICODIN) 5-325 MG tablet Take 1 tablet by mouth every 4 (four) hours as needed for severe pain. 06/28/15   Antony Madura, PA-C  ibuprofen (ADVIL,MOTRIN) 200 MG tablet Take 3 tablets (600 mg total) by mouth every 8 (eight) hours as needed (pain). 10/09/16   Amesha Bailey, PA-C  naproxen (NAPROSYN) 500 MG tablet Take 1 tablet (500 mg total) by mouth 2 (two) times daily. 06/28/15   Antony Madura, PA-C    Family History History reviewed. No pertinent family history.  Social History Social History  Substance Use Topics  . Smoking status: Current Every Day Smoker    Packs/day: 0.50    Types: Cigarettes  . Smokeless tobacco: Never Used  . Alcohol use  Yes     Comment: occasionally     Allergies   Patient has no known allergies.   Review of Systems Review of Systems  Skin: Positive for wound.  Neurological: Positive for numbness.  Hematological: Does not bruise/bleed easily.     Physical Exam Updated Vital Signs BP (!) 149/94 (BP Location: Right Arm)   Pulse 94   Temp 98.3 F (36.8 C) (Oral)   Resp 18   Ht 5\' 11"  (1.803 m)   Wt 99.8 kg (220 lb)   SpO2 99%   BMI 30.68 kg/m   Physical Exam  Constitutional: He is oriented to person, place, and time. He appears well-developed and well-nourished. No distress.  HENT:  Head: Normocephalic and atraumatic.  Eyes: EOM are normal.  Neck: Normal range of motion.  Pulmonary/Chest: Effort normal.  Abdominal: He exhibits no distension.  Musculoskeletal: Normal range of motion.  2 cm lac at the base of the thenar eminence. Some bleeding at this time. No obvious foreign body noted. Patient with decreased sensation of the palmar aspect of his thumb, but not full numbness. Patient is able to flex all 5 digits against resistance. Decreased range of motion of the thumb due to pain. Full range of motion of the fingers without pain. Radial pulses equal bilaterally. Color and warmth equal bilaterally.  Neurological: He is alert and oriented  to person, place, and time.  Skin: Skin is warm. Laceration noted. No rash noted.  Left palm  Psychiatric: He has a normal mood and affect.  Nursing note and vitals reviewed.    ED Treatments / Results  Labs (all labs ordered are listed, but only abnormal results are displayed) Labs Reviewed - No data to display  EKG  EKG Interpretation None       Radiology Dg Hand Complete Left  Result Date: 10/09/2016 CLINICAL DATA:  Initial evaluation for acute trauma, laceration to base of thumb. EXAM: LEFT HAND - COMPLETE 3+ VIEW COMPARISON:  None. FINDINGS: No acute fracture dislocation. 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 Mild soft tissue irregularity overlies the palmar aspect of the hand at the base of thumb, like related a laceration. No radiopaque foreign body. IMPRESSION: 1. Suspected soft tissue injury at the palmar aspect of the left hand near the base of thumb. No radiopaque foreign body. 2. No acute fracture dislocation. Electronically Signed   By: Rise Mu M.D.   On: 10/09/2016 21:11    Procedures .Marland KitchenLaceration Repair Date/Time: 10/09/2016 9:44 PM Performed by: Alveria Apley Authorized by: Alveria Apley   Consent:    Consent obtained:  Verbal   Consent given by:  Patient   Risks discussed:  Infection, pain and poor wound healing   Alternatives discussed:  No treatment Anesthesia (see MAR for exact dosages):    Anesthesia method:  Local infiltration   Local anesthetic:  Lidocaine 2% WITH epi Laceration details:    Location:  Hand   Hand location:  L palm   Length (cm):  1.5   Depth (mm):  2 Repair type:    Repair type:  Simple Pre-procedure details:    Preparation:  Patient was prepped and draped in usual sterile fashion Exploration:    Wound exploration: wound explored through full range of motion and entire depth of wound probed and visualized     Wound exploration comment:  Laceration was visualized to its fullest extent and a nonbloody field   Wound extent: no foreign bodies/material noted, no muscle damage noted and no tendon damage noted     Wound extent comment:  No visible tendons or visible muscle damage.   Contaminated: no   Treatment:    Area cleansed with:  Hibiclens   Amount of cleaning:  Standard Skin repair:    Repair method:  Sutures   Suture size:  5-0   Suture material:  Prolene   Suture technique:  Simple interrupted   Number of sutures:  3 Approximation:    Approximation:  Loose Post-procedure details:    Dressing:  Antibiotic ointment and sterile dressing   Patient tolerance of procedure:  Tolerated well, no immediate  complications    (including critical care time)  Medications Ordered in ED Medications  lidocaine-EPINEPHrine (XYLOCAINE W/EPI) 2 %-1:200000 (PF) injection 20 mL (20 mLs Infiltration Given by Other 10/09/16 2050)  Tdap (BOOSTRIX) injection 0.5 mL (0.5 mLs Intramuscular Given 10/09/16 2217)  HYDROcodone-acetaminophen (NORCO/VICODIN) 5-325 MG per tablet 1 tablet (1 tablet Oral Given 10/09/16 2049)     Initial Impression / Assessment and Plan / ED Course  I have reviewed the triage vital signs and the nursing notes.  Pertinent labs & imaging results that were available during my care of the patient were reviewed by me and considered in my medical decision making (see chart for details).     Patient presenting with laceration of the left palm. Strength against  resistance of thumb and all fingers intact. X-ray showed no foreign body. Discussed case with attending, Dr. Adriana Simasook evaluated the patient. Patient has some superficial decreased sensation of the palmar aspect of the thumb, likely due to superficial nerves. No obvious muscle or tendon damage. Laceration was loosely closed. No obvious contamination. Aftercare instructions given. Patient to follow-up with hand surgeon if he is concerned, or with PCP/ER/UC for suture removal. Patient appears safe for discharge. Return precautions given. Patient states he understands and agrees to plan.  Final Clinical Impressions(s) / ED Diagnoses   Final diagnoses:  Laceration of left hand without foreign body, initial encounter    New Prescriptions Discharge Medication List as of 10/09/2016  9:49 PM       Alveria ApleyCaccavale, Shahan Starks, PA-C 10/09/16 2243    Donnetta Hutchingook, Brian, MD 10/10/16 1730

## 2016-10-09 NOTE — ED Notes (Signed)
PT called concerning discharge prescriptions. PA was notified and advised for pt to use over the counter Neosporin with dressing change. Pt was contacted back and instructed on discharge care.

## 2016-10-09 NOTE — ED Triage Notes (Signed)
Cut to left base of thumb while stacking plates about one hour ago bleeding controlled with numbness in left thumb voiced unknown on last tetanus shot.

## 2017-04-29 IMAGING — CR DG ANKLE COMPLETE 3+V*R*
3 series · 3 of 3 positions shown · non-contrast
Comparison: None.

CLINICAL DATA: Ankle pain status post fall.

EXAM:
RIGHT ANKLE - COMPLETE 3+ VIEW

[x ankle ap right]
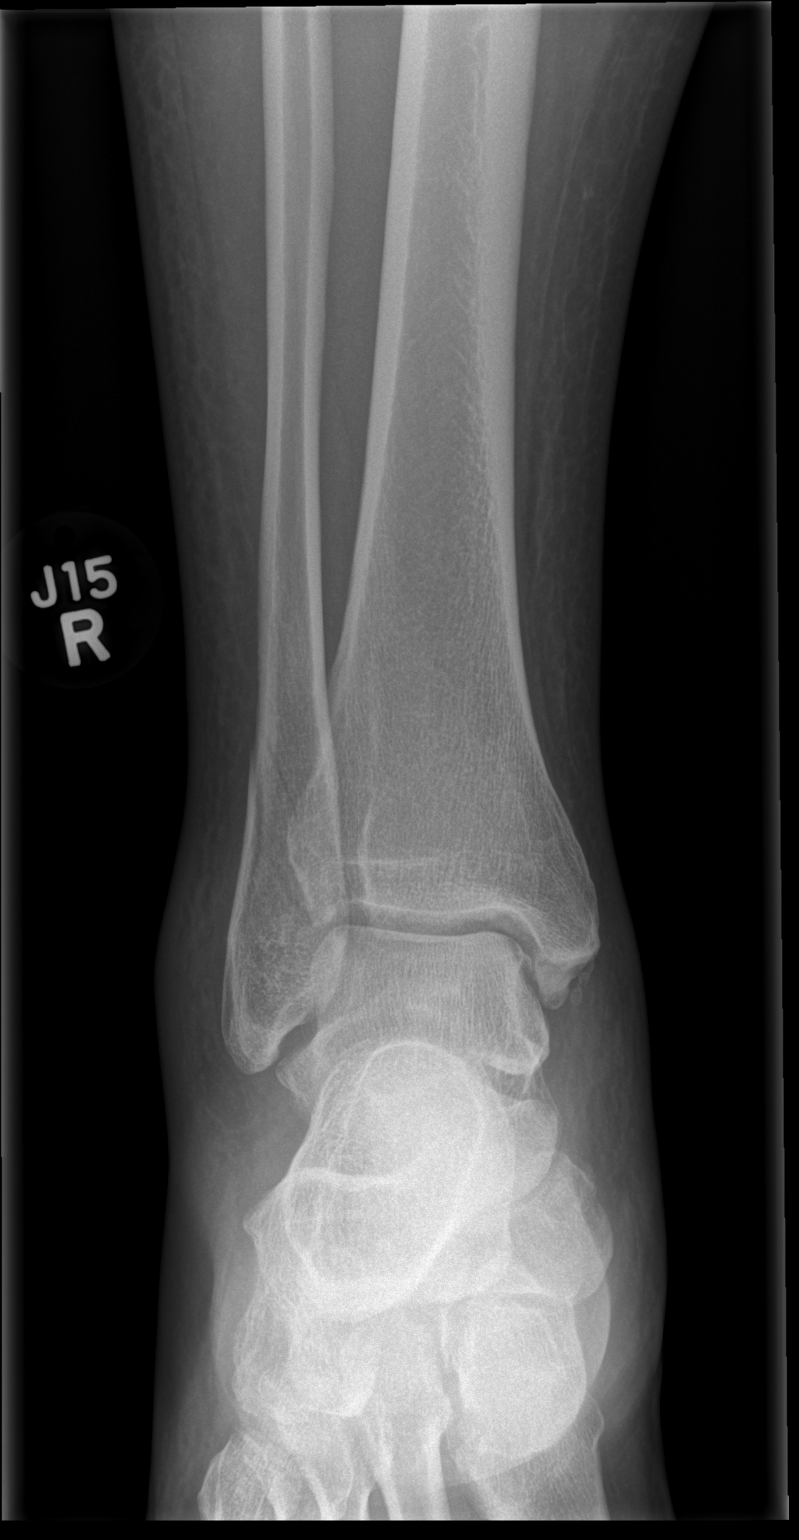

[x ankle obl right]
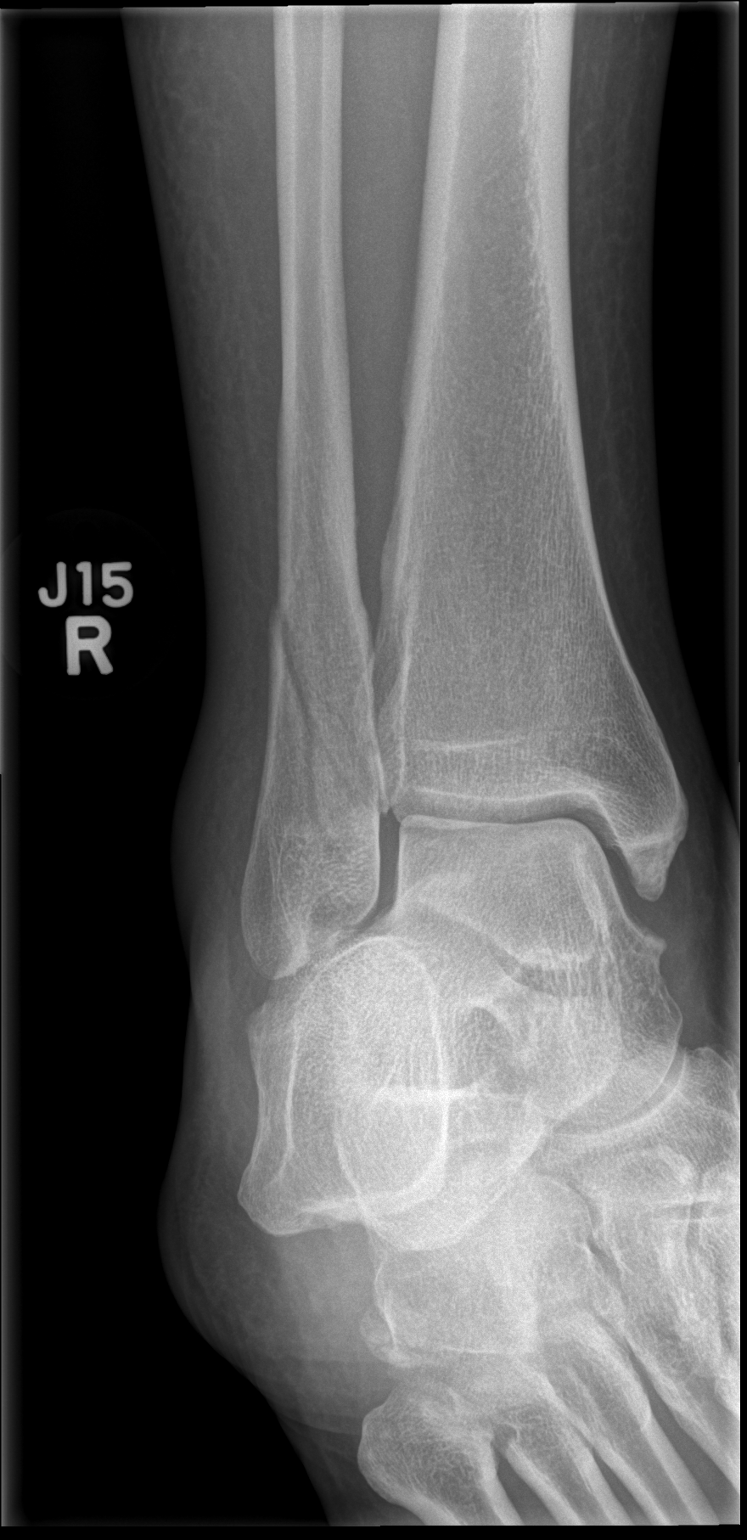

[x ankle lat right]
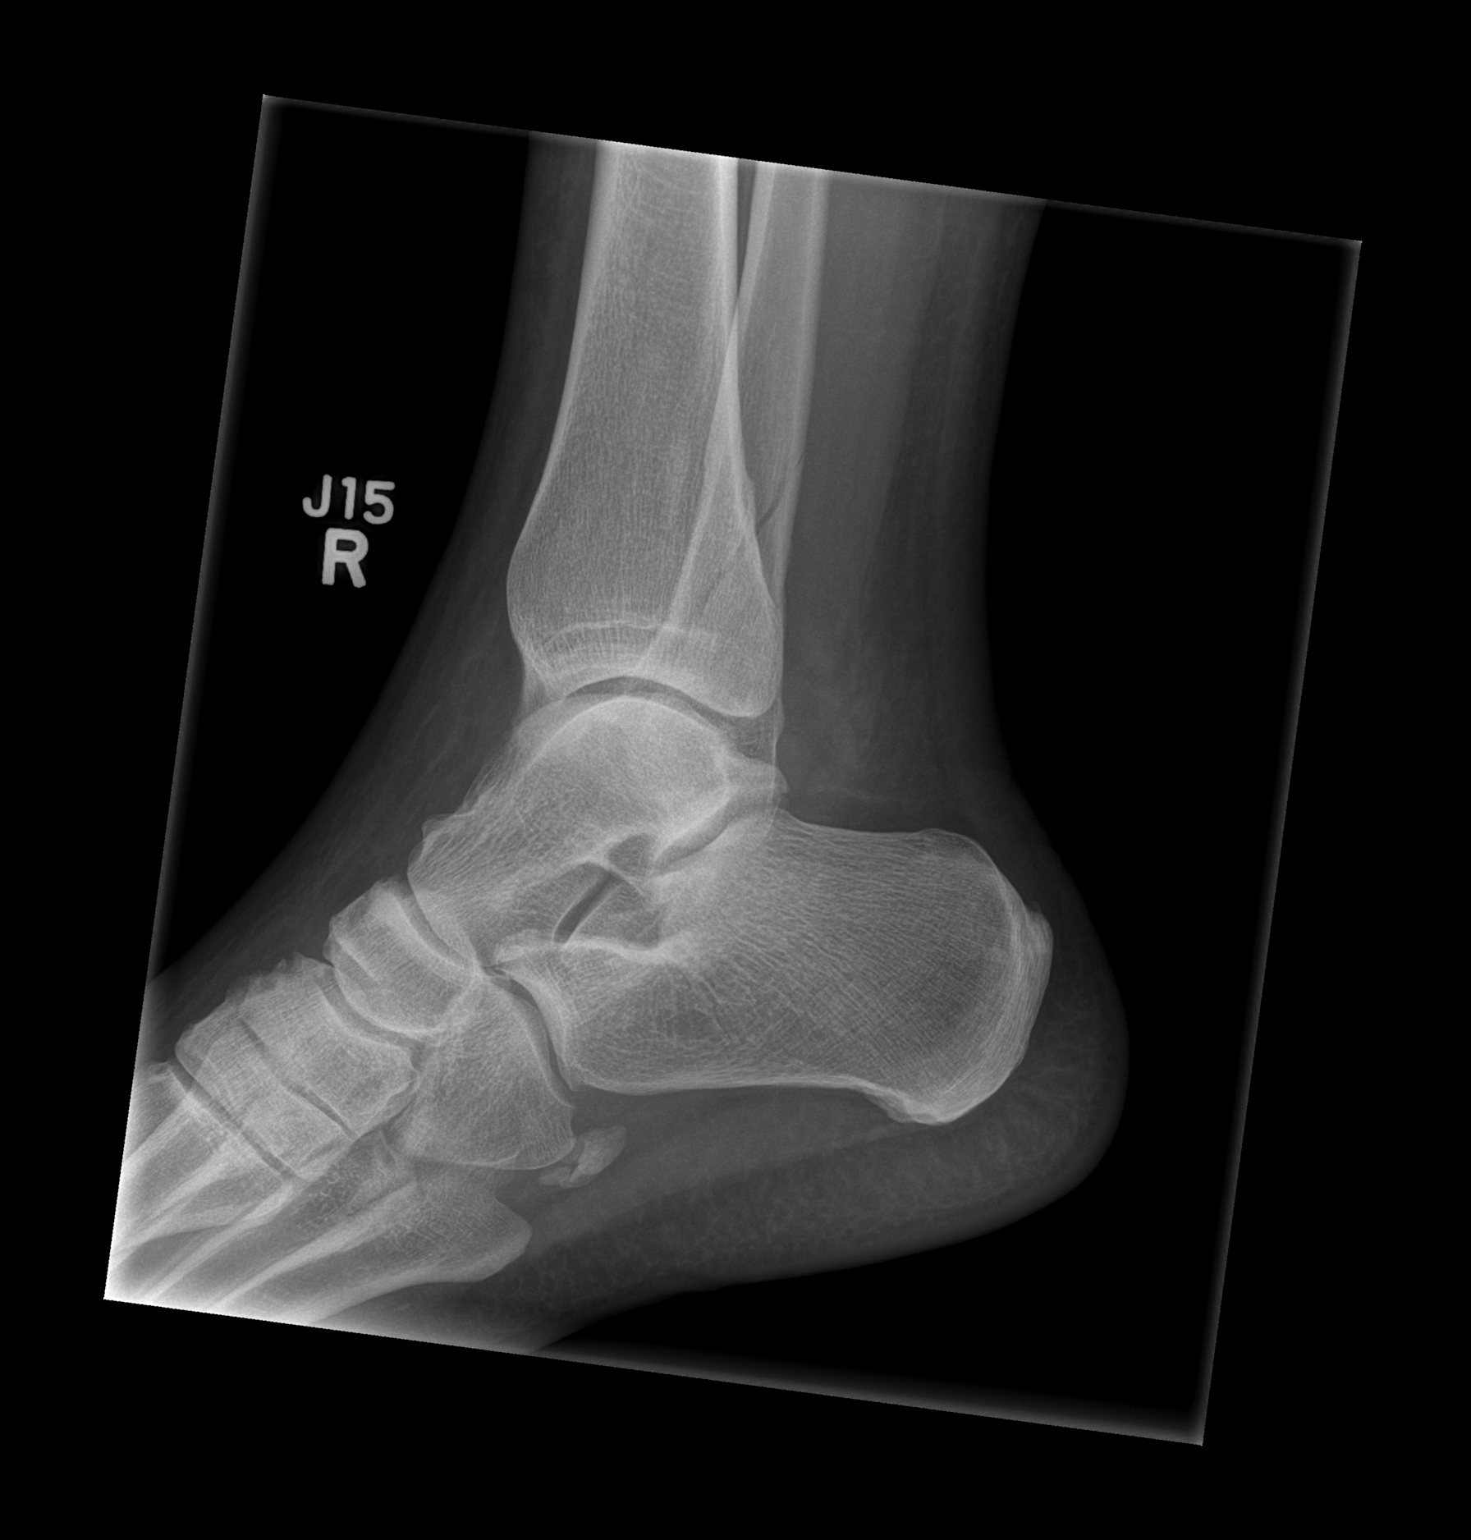

[3 of 3 positions shown; findings below may reference images not displayed]

FINDINGS: There is an oblique nondisplaced fracture of the distal fibula
extending proximally from the ankle joint by approximately 3.6 cm.
There is no widening of the ankle mortise. The distal tibia is
intact. There is no subluxation of the talus or associated
osteochondral lesion. Moderate lateral soft tissue swelling noted.
IMPRESSION: Oblique nondisplaced fracture of the distal fibula.

## 2017-04-29 IMAGING — CR DG FOOT COMPLETE 3+V*R*
3 series · 3 of 3 positions shown · non-contrast
Comparison: None.

CLINICAL DATA: Cleaning hot tub and slipped and fell bending right
foot backwards

EXAM:
RIGHT FOOT COMPLETE - 3+ VIEW

[x foot lat right]
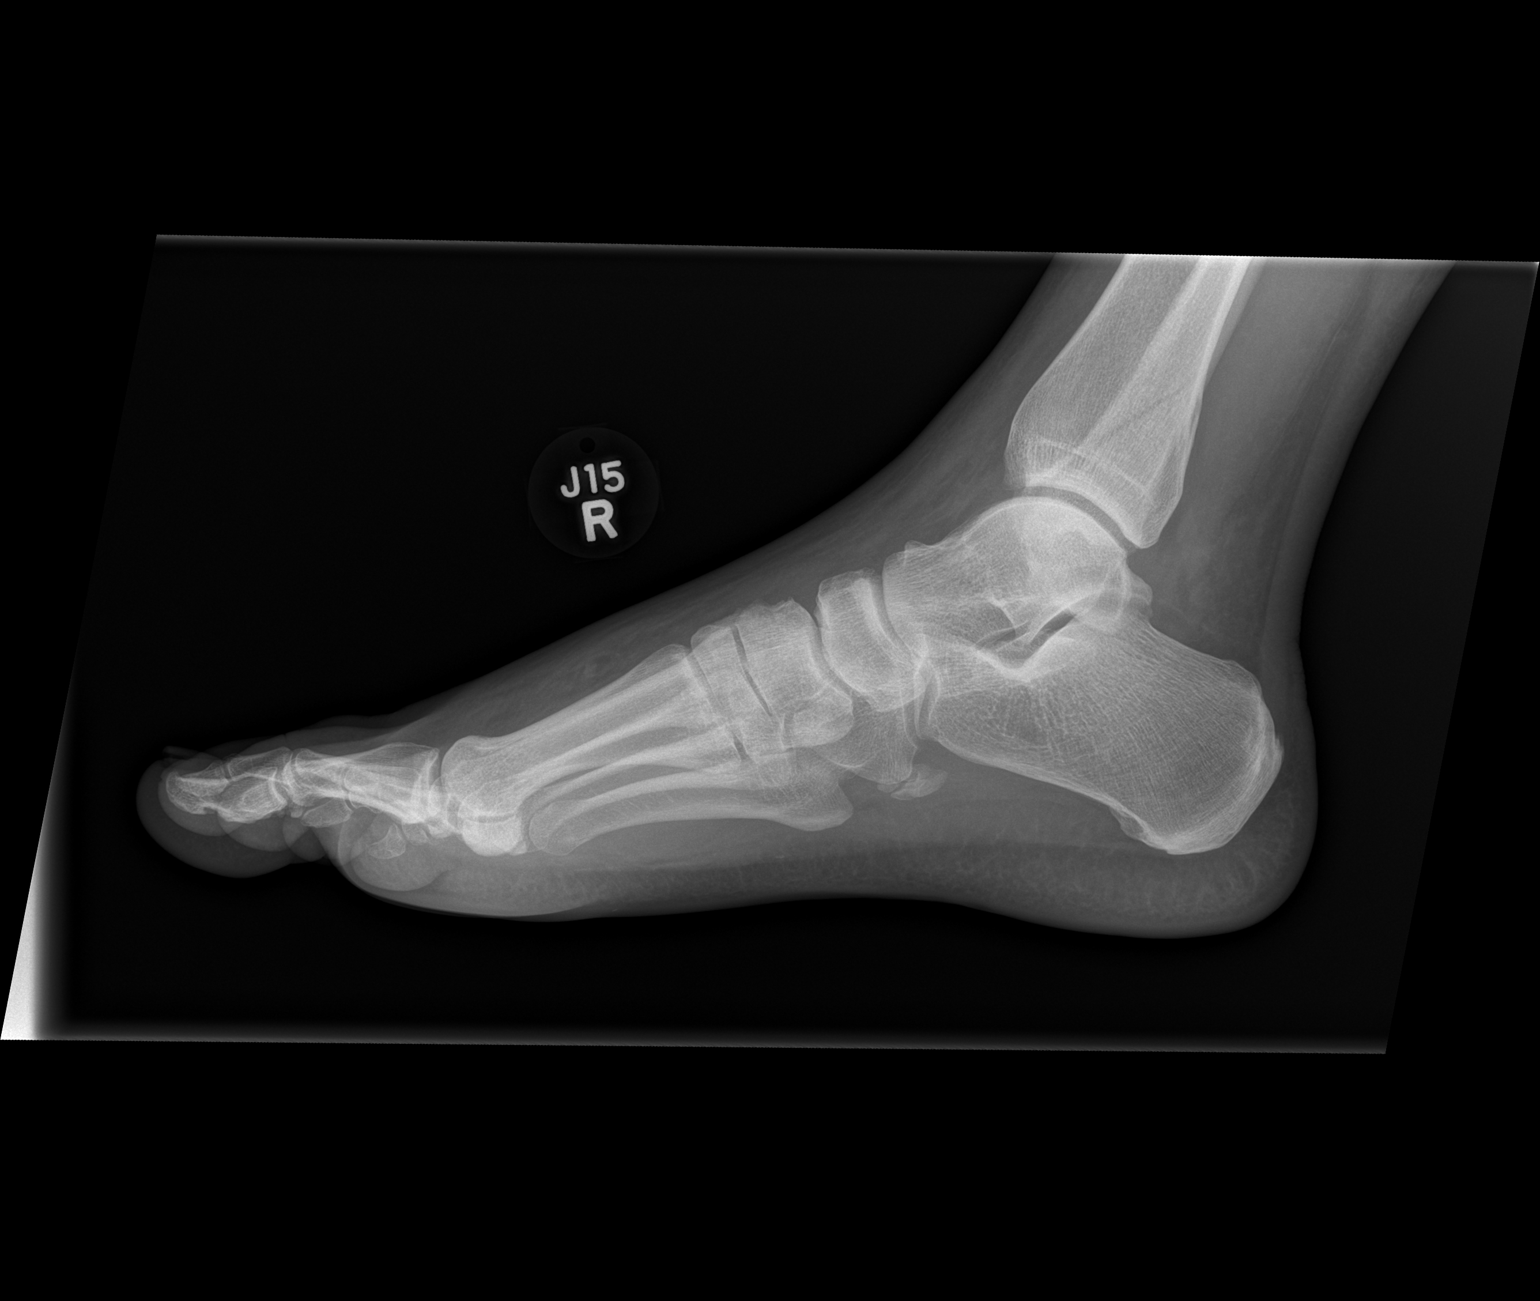

[x foot ap right]
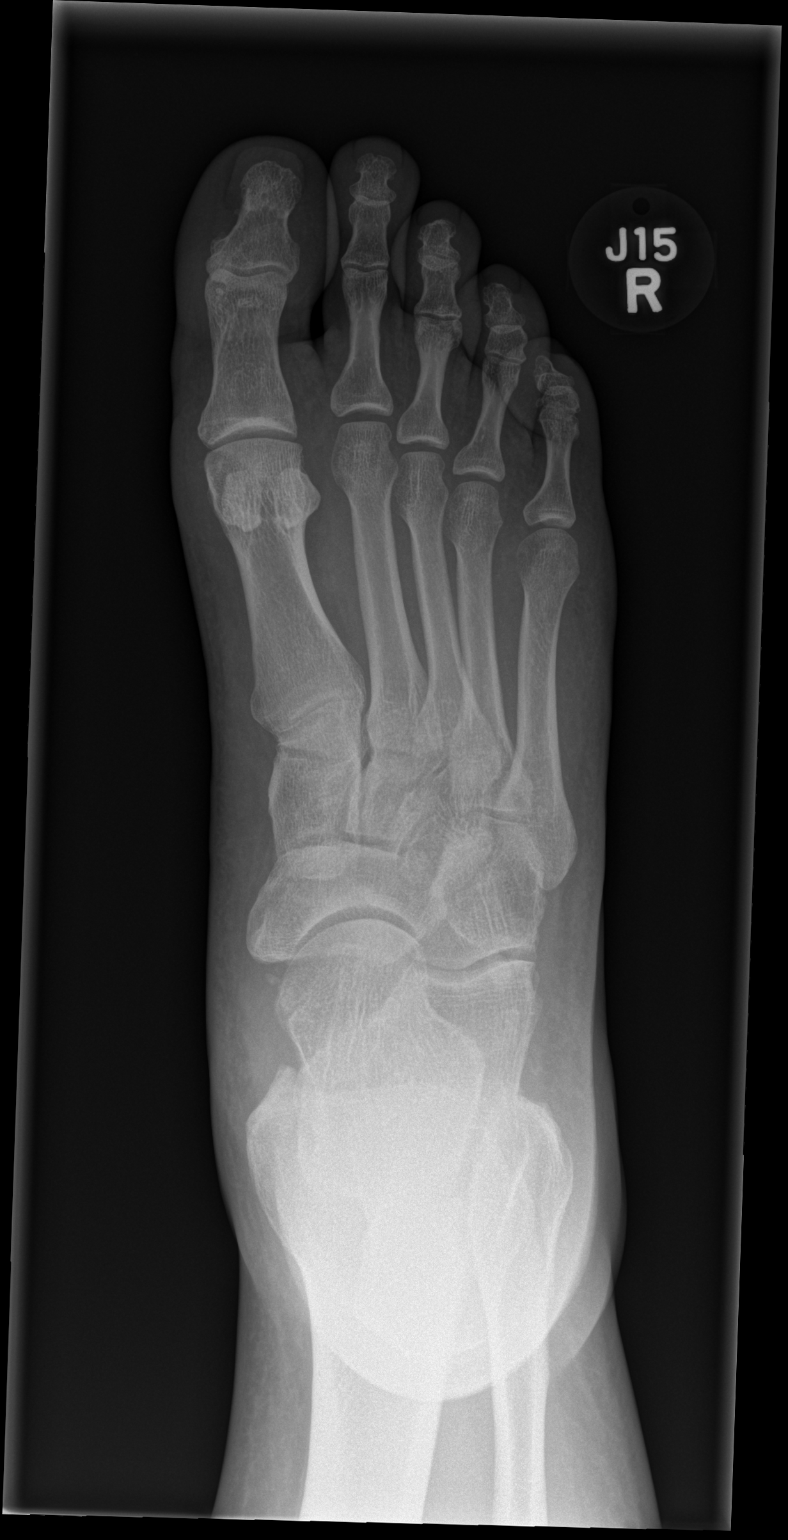

[x foot obl right]
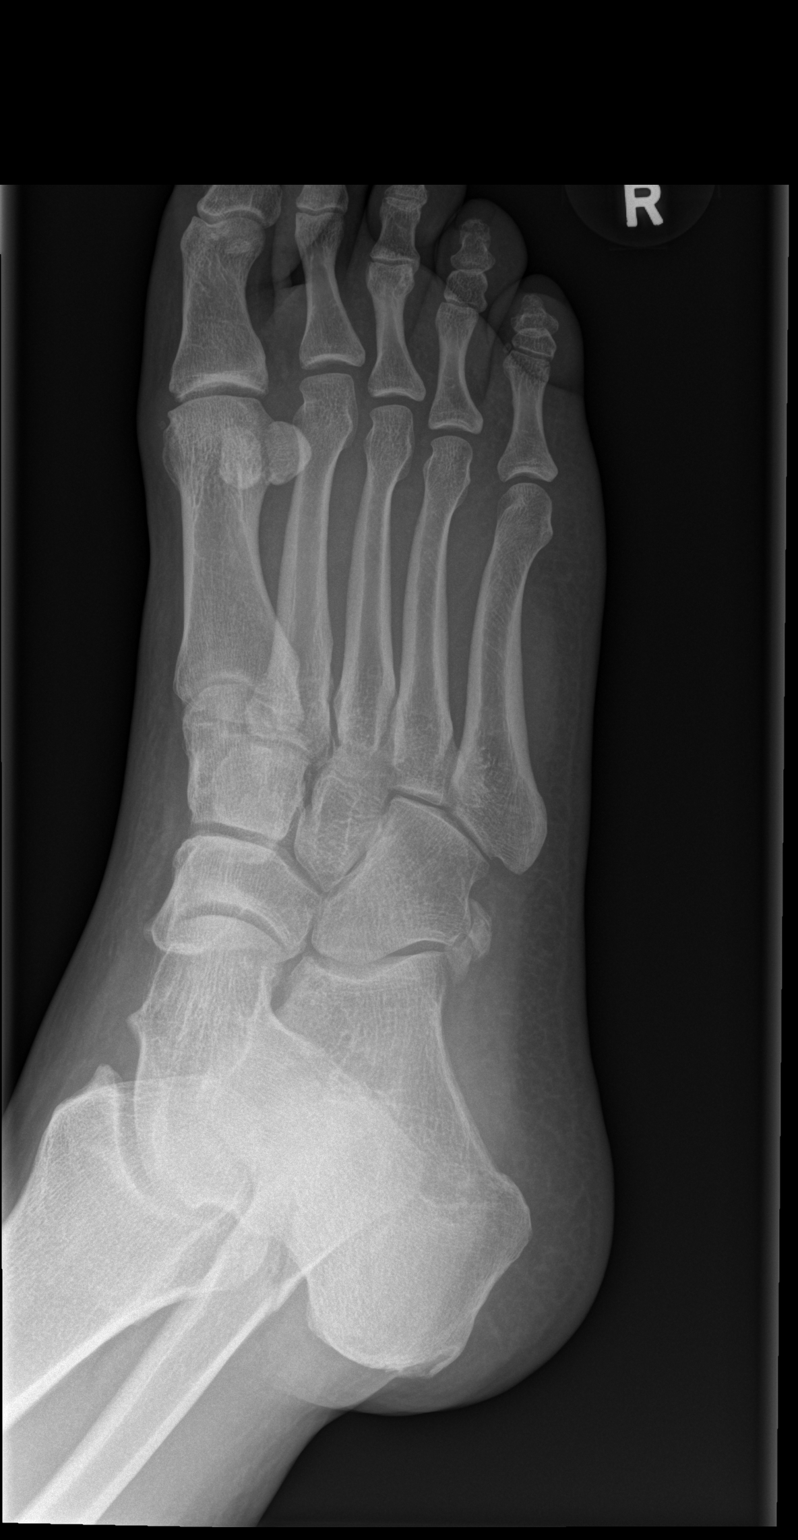

[3 of 3 positions shown; findings below may reference images not displayed]

FINDINGS: Obliquely oriented fracture involving the distal fibula noted. No
significant displacement of the fracture fragments. There is no
evidence of arthropathy or other focal bone abnormality. Soft
tissues are unremarkable.
IMPRESSION: Acute fracture involves the distal fibula.

## 2018-02-21 ENCOUNTER — Encounter (HOSPITAL_COMMUNITY): Payer: Self-pay | Admitting: Pharmacy Technician

## 2018-02-21 ENCOUNTER — Emergency Department (HOSPITAL_COMMUNITY)
Admission: EM | Admit: 2018-02-21 | Discharge: 2018-02-21 | Disposition: A | Discharge status: Home / Self Care | Payer: Self-pay | Attending: Emergency Medicine | Admitting: Emergency Medicine

## 2018-02-21 ENCOUNTER — Emergency Department (HOSPITAL_COMMUNITY): Payer: Self-pay

## 2018-02-21 DIAGNOSIS — Y990 Civilian activity done for income or pay: Secondary | ICD-10-CM | POA: Insufficient documentation

## 2018-02-21 DIAGNOSIS — Y9289 Other specified places as the place of occurrence of the external cause: Secondary | ICD-10-CM | POA: Insufficient documentation

## 2018-02-21 DIAGNOSIS — S39012A Strain of muscle, fascia and tendon of lower back, initial encounter: Secondary | ICD-10-CM | POA: Insufficient documentation

## 2018-02-21 DIAGNOSIS — F17210 Nicotine dependence, cigarettes, uncomplicated: Secondary | ICD-10-CM | POA: Insufficient documentation

## 2018-02-21 DIAGNOSIS — Y9389 Activity, other specified: Secondary | ICD-10-CM | POA: Insufficient documentation

## 2018-02-21 DIAGNOSIS — I10 Essential (primary) hypertension: Secondary | ICD-10-CM | POA: Insufficient documentation

## 2018-02-21 DIAGNOSIS — Y33XXXA Other specified events, undetermined intent, initial encounter: Secondary | ICD-10-CM | POA: Insufficient documentation

## 2018-02-21 MED ORDER — ACETAMINOPHEN 500 MG PO TABS
1000.0000 mg | ORAL_TABLET | Freq: Once | ORAL | Status: AC
  Administered 2018-02-21: 1000 mg via ORAL
  Filled 2018-02-21: qty 2

## 2018-02-21 MED ORDER — METHOCARBAMOL 750 MG PO TABS: 750.0000 mg | ORAL_TABLET | Freq: Three times a day (TID) | ORAL | 0 refills | Status: DC | PRN

## 2018-02-21 MED ORDER — METHOCARBAMOL 500 MG PO TABS
1000.0000 mg | ORAL_TABLET | Freq: Once | ORAL | Status: AC
  Administered 2018-02-21: 1000 mg via ORAL
  Filled 2018-02-21: qty 2

## 2018-02-21 NOTE — ED Notes (Signed)
Patient verbalizes understanding of discharge instructions. Opportunity for questioning and answers were provided. 

## 2018-02-21 NOTE — ED Provider Notes (Signed)
[facility]   CSN: [id number] Arrival date & time: 02/21/18  1348     History   Chief Complaint Chief Complaint  Patient presents with  . Back Pain    HPI Alejandro Santiago is a 42 y.o. male.  Patient c/o low back injury at work yesterday. States bent over to pick up an odd shaped 100 lb object/package, and felt pull/pop sensation in lower back with acute onset low back pain. Pain constant, dull, mod-severe, non radiating. No numbness/weakness. No bowel or bladder problems. Denies hx chronic low back pain or ddd. No fever or chills. Took motrin without relief today.   The history is provided by the patient.  Back Pain   Pertinent negatives include no chest pain, no fever, no numbness, no abdominal pain and no weakness.    Past Medical History:  Diagnosis Date  . Hypertension     There are no active problems to display for this patient.   History reviewed. No pertinent surgical history.      Home Medications    Prior to Admission medications   Medication Sig Start Date End Date Taking? Authorizing Provider  HYDROcodone-acetaminophen (NORCO/VICODIN) 5-325 MG tablet Take 1 tablet by mouth every 4 (four) hours as needed for severe pain. 06/28/15   Antony MaduraHumes, Kelly, PA-C  ibuprofen (ADVIL,MOTRIN) 200 MG tablet Take 3 tablets (600 mg total) by mouth every 8 (eight) hours as needed (pain). 10/09/16   Caccavale, Sophia, PA-C  naproxen (NAPROSYN) 500 MG tablet Take 1 tablet (500 mg total) by mouth 2 (two) times daily. 06/28/15   Antony MaduraHumes, Kelly, PA-C    Family History No family history on file.  Social History Social History   Tobacco Use  . Smoking status: Current Every Day Smoker    Packs/day: 0.50    Types: Cigarettes  . Smokeless tobacco: Never Used  Substance Use Topics  . Alcohol use: Yes    Comment: occasionally  . Drug use: No     Allergies   Patient has no known allergies.   Review of Systems Review of  Systems  Constitutional: Negative for fever.  Respiratory: Negative for shortness of breath.   Cardiovascular: Negative for chest pain.  Gastrointestinal: Negative for abdominal pain.  Musculoskeletal: Positive for back pain. Negative for neck pain.  Neurological: Negative for weakness and numbness.     Physical Exam Updated Vital Signs BP (!) 137/94   Pulse 84   Resp 18   Ht 1.803 m (5\' 11" )   Wt 97.5 kg   SpO2 98%   BMI 29.99 kg/m   Physical Exam Vitals signs and nursing note reviewed.  Constitutional:      Appearance: He is well-developed.  HENT:     Head: Atraumatic.  Eyes:     Conjunctiva/sclera: Conjunctivae normal.  Neck:     Musculoskeletal: Neck supple.     Trachea: No tracheal deviation.  Cardiovascular:     Rate and Rhythm: Normal rate.  Pulmonary:     Effort: Pulmonary effort is normal. No accessory muscle usage or respiratory distress.  Musculoskeletal:     Comments: Mid to upper lumbar tenderness, and lumbar paraspinal muscular tenderness. No focal sts or skin changes noted. Remainder T/L/S spine non tender, aligned.   Skin:    General: Skin is warm and dry.     Findings: No rash.  Neurological:     Mental Status: He is alert.     Comments: Speech clear/fluent. Motor/sens grossly intact bil.  Steady gait.   Psychiatric:        Mood and Affect: Mood normal.      ED Treatments / Results  Labs (all labs ordered are listed, but only abnormal results are displayed) Labs Reviewed - No data to display  EKG None  Radiology Dg Lumbar Spine Complete  Result Date: 02/21/2018 CLINICAL DATA:  General low back pain EXAM: LUMBAR SPINE - COMPLETE 4+ VIEW COMPARISON:  None. FINDINGS: There is no evidence of lumbar spine fracture. Alignment is normal. Slight intervertebral disc space flattening at L4-5 and L5-S1. No pars defects or listhesis. No suspicious osseous lesions. IMPRESSION: Slight intervertebral disc space flattening at L4-5 and L5-S1. No acute  osseous abnormality. Electronically Signed   By: Tollie Ethavid  Kwon M.D.   On: 02/21/2018 15:08    Procedures Procedures (including critical care time)  Medications Ordered in ED Medications  methocarbamol (ROBAXIN) tablet 1,000 mg (has no administration in time range)  acetaminophen (TYLENOL) tablet 1,000 mg (has no administration in time range)     Initial Impression / Assessment and Plan / ED Course  I have reviewed the triage vital signs and the nursing notes.  Pertinent labs & imaging results that were available during my care of the patient were reviewed by me and considered in my medical decision making (see chart for details).  Pt indicates has ride, does not have to drive. Took motrin today.  Robaxin po, acetaminophen po. Imaging.   Reviewed nursing notes and prior charts for additional history.   xrays reviewed - no fx, ?ddd.  Pt appears stable for d/c.     Final Clinical Impressions(s) / ED Diagnoses   Final diagnoses:  None    ED Discharge Orders    None       Cathren LaineSteinl, Claudene Gatliff, MD 02/21/18 [phone]

## 2018-02-21 NOTE — ED Notes (Signed)
EDP at the bedside.  ?

## 2018-02-21 NOTE — Discharge Instructions (Addendum)
It was our pleasure to provide your ER care today - we hope that you feel better.  Take motrin or aleve as need for pain.  You may also take robaxin as need for muscle pain/spasm - no driving when taking.  Avoid heavy lifting > 20 lbs for the next 3 days, or bending at waist.  Follow up with primary care doctor/workers comp doctor in the coming week.  Return to ER if worse, new symptoms, numbness/weakness, other concern.

## 2018-02-21 NOTE — ED Triage Notes (Signed)
Pt arrives POV with back pain radiating to groin after lifting heavy item at work yesterday. Denies loss of bowel/bladder control.

## 2018-05-24 ENCOUNTER — Telehealth: Payer: Self-pay | Admitting: Nurse Practitioner

## 2018-05-24 DIAGNOSIS — R059 Cough, unspecified: Secondary | ICD-10-CM

## 2018-05-24 DIAGNOSIS — R05 Cough: Secondary | ICD-10-CM

## 2018-05-24 MED ORDER — BENZONATATE 100 MG PO CAPS: 100.0000 mg | ORAL_CAPSULE | Freq: Three times a day (TID) | ORAL | 0 refills | Status: DC | PRN

## 2018-05-24 NOTE — Progress Notes (Signed)
E-Visit for Corona Virus Screening  Based on your current symptoms, it seems unlikely that your symptoms are related to the Coronavirus.   Coronavirus disease 2019 (COVID-19) is a respiratory illness that can spread from person to person. The virus that causes COVID-19 is a new virus that was first identified in the country of [place] but is now found in multiple other countries and has spread to the [place].  Symptoms associated with the virus are mild to severe fever, cough, and shortness of breath. There is currently no vaccine to protect against COVID-19, and there is no specific antiviral treatment for the virus.   To be considered HIGH RISK for Coronavirus (COVID-19), you have to meet the following criteria:  . Traveled to [place], [place], [place], [place] or [place]; or in the [place] to [place], [place], [place], or [place]; and have fever, cough, and shortness of breath within the last 2 weeks of travel OR  . Been in close contact with a person diagnosed with COVID-19 within the last 2 weeks and have fever, cough, and shortness of breath  . IF YOU DO NOT MEET THESE CRITERIA, YOU ARE CONSIDERED LOW RISK FOR COVID-19.   It is vitally important that if you feel that you have an infection such as this virus or any other virus that you stay home and away from places where you may spread it to others.  You should self-quarantine for 14 days if you have symptoms that could potentially be coronavirus and avoid contact with people age 65 and older.   You can use medication such as A prescription cough medication called Tessalon Perles 100 mg. You may take 1-2 capsules every 8 hours as needed for cough  You may also take acetaminophen (Tylenol) as needed for fever.   Reduce your risk of any infection by using the same precautions used for avoiding the common cold or flu:  . Wash your hands often with soap and warm water for at least 20 seconds.  If soap and water are not readily  available, use an alcohol-based hand sanitizer with at least 60% alcohol.  . If coughing or sneezing, cover your mouth and nose by coughing or sneezing into the elbow areas of your shirt or coat, into a tissue or into your sleeve (not your hands). . Avoid shaking hands with others and consider head nods or verbal greetings only. . Avoid touching your eyes, nose, or mouth with unwashed hands.  . Avoid close contact with people who are sick. . Avoid places or events with large numbers of people in one location, like concerts or sporting events. . Carefully consider travel plans you have or are making. . If you are planning any travel outside or inside the [place], visit the CDC's Travelers' Health webpage for the latest health notices. . If you have some symptoms but not all symptoms, continue to monitor at home and seek medical attention if your symptoms worsen. . If you are having a medical emergency, call 911.  HOME CARE . Only take medications as instructed by your medical team. . Drink plenty of fluids and get plenty of rest. . A steam or ultrasonic humidifier can help if you have congestion.   GET HELP RIGHT AWAY IF: . You develop worsening fever. . You become short of breath . You cough up blood. . Your symptoms become more severe MAKE SURE YOU   Understand these instructions.  Will watch your condition.  Will get help   right away if you are not doing well or get worse.  Your e-visit answers were reviewed by a board certified advanced clinical practitioner to complete your personal care plan.  Depending on the condition, your plan could have included both over the counter or prescription medications.  If there is a problem please reply once you have received a response from your provider. Your safety is important to us.  If you have drug allergies check your prescription carefully.    You can use MyChart to ask questions about today's visit, request a non-urgent call back, or ask for a  work or school excuse for 24 hours related to this e-Visit. If it has been greater than 24 hours you will need to follow up with your provider, or enter a new e-Visit to address those concerns. You will get an e-mail in the next two days asking about your experience.  I hope that your e-visit has been valuable and will speed your recovery. Thank you for using e-visits.   5 minutes spent reviewing and documenting in chart.  

## 2019-02-20 ENCOUNTER — Other Ambulatory Visit: Payer: Self-pay

## 2019-02-20 ENCOUNTER — Encounter (HOSPITAL_COMMUNITY): Payer: Self-pay | Admitting: *Deleted

## 2019-02-20 ENCOUNTER — Emergency Department (HOSPITAL_COMMUNITY): Payer: Self-pay

## 2019-02-20 ENCOUNTER — Emergency Department (HOSPITAL_COMMUNITY)
Admission: EM | Admit: 2019-02-20 | Discharge: 2019-02-20 | Disposition: A | Discharge status: Home / Self Care | Payer: Self-pay | Attending: Emergency Medicine | Admitting: Emergency Medicine

## 2019-02-20 DIAGNOSIS — M545 Low back pain, unspecified: Secondary | ICD-10-CM

## 2019-02-20 DIAGNOSIS — F17210 Nicotine dependence, cigarettes, uncomplicated: Secondary | ICD-10-CM | POA: Insufficient documentation

## 2019-02-20 DIAGNOSIS — I10 Essential (primary) hypertension: Secondary | ICD-10-CM | POA: Insufficient documentation

## 2019-02-20 MED ORDER — METHOCARBAMOL 500 MG PO TABS: 500.0000 mg | ORAL_TABLET | Freq: Two times a day (BID) | ORAL | 0 refills | Status: DC

## 2019-02-20 MED ORDER — HYDROCODONE-ACETAMINOPHEN 5-325 MG PO TABS
1.0000 | ORAL_TABLET | Freq: Once | ORAL | Status: AC
  Administered 2019-02-20: 15:00:00 1 via ORAL
  Filled 2019-02-20: qty 1

## 2019-02-20 MED ORDER — LIDOCAINE 5 % EX PTCH: 1.0000 | MEDICATED_PATCH | CUTANEOUS | 0 refills | Status: DC

## 2019-02-20 MED ORDER — KETOROLAC TROMETHAMINE 30 MG/ML IJ SOLN
30.0000 mg | Freq: Once | INTRAMUSCULAR | Status: AC
  Administered 2019-02-20: 15:00:00 30 mg via INTRAMUSCULAR
  Filled 2019-02-20: qty 1

## 2019-02-20 MED ORDER — HYDROCODONE-ACETAMINOPHEN 5-325 MG PO TABS: 1.0000 | ORAL_TABLET | ORAL | 0 refills | Status: DC | PRN

## 2019-02-20 MED ORDER — LIDOCAINE 5 % EX PTCH
1.0000 | MEDICATED_PATCH | CUTANEOUS | Status: DC
  Administered 2019-02-20: 1 via TRANSDERMAL
  Filled 2019-02-20: qty 1

## 2019-02-20 NOTE — ED Provider Notes (Signed)
[facility]-EMERGENCY DEPT Provider Note   CSN: [id number] Arrival date & time: 02/20/19  1343     History Chief Complaint  Patient presents with   Back Pain    Alejandro Santiago is a 43 y.o. male with possible history significant for hypertension who presents for evaluation of back pain.  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  Pain does not radiate.  Described pain as aching.  Pain does not radiate into his abdomen or chest.  Denies IV drug use, bowel or bladder incontinence, saddle paresthesias, fever, chills, nausea, vomiting, chest pain, shortness of breath, abdominal pain, diarrhea, dysuria, decreased range of motion to extremities.  Has been taking Ibuprofen with mild relief of his pain.  Pain worse with movement.  His current pain a 6/10.  Denies additional aggravating or alleviating factors. No flank pain, hx of aneurysm.   History pain from patient and past medical records.  No interpreter is used.  HPI     Past Medical History:  Diagnosis Date   Hypertension     There are no problems to display for this patient.   History reviewed. No pertinent surgical history.     No family history on file.  Social History   Tobacco Use   Smoking status: Current Every Day Smoker    Packs/day: 0.50    Types: Cigarettes   Smokeless tobacco: Never Used  Substance Use Topics   Alcohol use: Yes    Comment: occasionally   Drug use: No    Home Medications Prior to Admission medications   Medication Sig Start Date End Date Taking? Authorizing Provider  benzonatate (TESSALON PERLES) 100 MG capsule Take 1 capsule (100 mg total) by mouth 3 (three) times daily as needed. 05/24/18   Daphine DeutscherMartin, Mary-Margaret, FNP  HYDROcodone-acetaminophen (NORCO/VICODIN) 5-325 MG tablet Take 1 tablet by mouth every 4 (four) hours as needed. 02/20/19   Manuelito Poage A, PA-C    ibuprofen (ADVIL,MOTRIN) 200 MG tablet Take 3 tablets (600 mg total) by mouth every 8 (eight) hours as needed (pain). 10/09/16   Caccavale, Sophia, PA-C  lidocaine (LIDODERM) 5 % Place 1 patch onto the skin daily. Remove & Discard patch within 12 hours or as directed by MD 02/20/19   Bellanie Matthew A, PA-C  methocarbamol (ROBAXIN) 500 MG tablet Take 1 tablet (500 mg total) by mouth 2 (two) times daily. 02/20/19   Yehuda Printup A, PA-C  naproxen (NAPROSYN) 500 MG tablet Take 1 tablet (500 mg total) by mouth 2 (two) times daily. 06/28/15   Antony MaduraHumes, Kelly, PA-C    Allergies    Patient has no known allergies.  Review of Systems   Review of Systems  Constitutional: Negative.   HENT: Negative.   Respiratory: Negative.   Cardiovascular: Negative.   Gastrointestinal: Negative.   Genitourinary: Negative.   Musculoskeletal: Positive for back pain. Negative for arthralgias, gait problem, joint swelling, myalgias, neck pain and neck stiffness.  Skin: Negative.   Neurological: Negative.   All other systems reviewed and are negative.  Physical Exam Updated Vital Signs BP (!) 134/96 (BP Location: Right Arm)    Pulse 88    Temp 99.1 F (37.3 C) (Oral)    Resp 15    Ht 6' (1.829 m)    Wt 90.7 kg    SpO2 97%    BMI 27.12 kg/m   Physical Exam  Physical Exam  Constitutional: Pt appears well-developed and well-nourished. No distress.  HENT:  Head: Normocephalic and atraumatic.  Mouth/Throat: Oropharynx is clear and moist. No oropharyngeal exudate.  Eyes: Conjunctivae are normal.  Neck: Normal range of motion. Neck supple.  Full ROM without pain  Cardiovascular: Normal rate, regular rhythm and intact distal pulses.  2+ radial, DP, PT pulses Pulmonary/Chest: Effort normal and breath sounds normal. No respiratory distress. Pt has no wheezes.  Abdominal: Soft. Pt exhibits no distension. There is no tenderness, rebound or guarding. No abd bruit or pulsatile mass Musculoskeletal:  Full range of  motion of the T-spine and L-spine with flexion, hyperextension, and lateral flexion. No midline tenderness or stepoffs. No tenderness to palpation of the spinous processes of the T-spine or L-spine. Mild tenderness to palpation of the paraspinous muscles of the T-spine,  L-spine. Negative straight leg raise.  Palpable spasm to paraspinal muscles to T11/L1.  No crepitus, step-offs. Lymphadenopathy:    Pt has no cervical adenopathy.  Neurological: Pt is alert. Pt has normal reflexes.  Reflex Scores:      Bicep reflexes are 2+ on the right side and 2+ on the left side.      Brachioradialis reflexes are 2+ on the right side and 2+ on the left side.      Patellar reflexes are 2+ on the right side and 2+ on the left side.      Achilles reflexes are 2+ on the right side and 2+ on the left side. Speech is clear and goal oriented, follows commands Normal 5/5 strength in upper and lower extremities bilaterally including dorsiflexion and plantar flexion, strong and equal grip strength Sensation normal to light and sharp touch Moves extremities without ataxia, coordination intact Normal gait Normal balance No Clonus Skin: Skin is warm and dry. No rash noted or lesions noted. Pt is not diaphoretic. No erythema, ecchymosis,edema or warmth.  Psychiatric: Pt has a normal mood and affect. Behavior is normal.  Nursing note and vitals reviewed. ED Results / Procedures / Treatments   Labs (all labs ordered are listed, but only abnormal results are displayed) Labs Reviewed - No data to display  EKG None  Radiology DG Thoracic Spine 2 View  Result Date: 02/20/2019 CLINICAL DATA:  Fall, back pain EXAM: THORACIC SPINE 2 VIEWS COMPARISON:  None. FINDINGS: There is no evidence of thoracic spine fracture. Alignment is normal. No other significant bone abnormalities are identified. IMPRESSION: Negative. Electronically Signed   By: Guadlupe Spanish M.D.   On: 02/20/2019 14:33   DG Lumbar Spine Complete  Result  Date: 02/20/2019 CLINICAL DATA:  Fall, back pain EXAM: LUMBAR SPINE - COMPLETE 4+ VIEW COMPARISON:  None. FINDINGS: No evidence of lumbar spine fracture. Vertebral body heights and alignment are maintained. Intervertebral disc heights are preserved. There is no spondylolysis. IMPRESSION: Negative. Electronically Signed   By: Guadlupe Spanish M.D.   On: 02/20/2019 14:35    Procedures Procedures (including critical care time)  Medications Ordered in ED Medications  ketorolac (TORADOL) 30 MG/ML injection 30 mg (has no administration in time range)  HYDROcodone-acetaminophen (NORCO/VICODIN) 5-325 MG per tablet 1 tablet (has no administration in time range)  lidocaine (LIDODERM) 5 % 1 patch (has no administration in time range)   ED Course  I have reviewed the triage vital signs and the nursing notes.  Pertinent labs & imaging results that were available during my care of the patient were reviewed by me and considered in my medical decision making (see chart  for details).  43 year old presents for evaluation of back pain after lifting object at work.  Pain does not radiate.  No red flags for back pain.  Patient is neurovascularly intact, no chest pain, shortness of breath, abdominal pain, hemoptysis.  Afebrile, nonseptic, non-ill-appearing.  No pulsatile abdominal mass or abdominal bruits.  Pain reproducible to palpation. I have low suspicion for atypical ACS, PE, AAA, dissection as cause of pain.  No suspicion for cauda equina, discitis, osteomyelitis, transverse myelitis.  Will treat symptomatically and have him follow-up with orthopedics outpatient if symptoms unresolved.  No neurological deficits and normal neuro exam. No fever, night sweats, weight loss, h/o cancer, IVDU.  RICE protocol and pain medicine indicated and discussed with patient.   The patient has been appropriately medically screened and/or stabilized in the ED. I have low suspicion for any other emergent medical condition which  would require further screening, evaluation or treatment in the ED or require inpatient management.  Patient is hemodynamically stable and in no acute distress.  Patient able to ambulate in department prior to ED.  Evaluation does not show acute pathology that would require ongoing or additional emergent interventions while in the emergency department or further inpatient treatment.  I have discussed the diagnosis with the patient and answered all questions.  Pain is been managed while in the emergency department and patient has no further complaints prior to discharge.  Patient is comfortable with plan discussed in room and is stable for discharge at this time.  I have discussed strict return precautions for returning to the emergency department.  Patient was encouraged to follow-up with PCP/specialist refer to at discharge.     MDM Rules/Calculators/A&P                      Final Clinical Impression(s) / ED Diagnoses Final diagnoses:  Acute bilateral low back pain without sciatica    Rx / DC Orders ED Discharge Orders         Ordered    HYDROcodone-acetaminophen (NORCO/VICODIN) 5-325 MG tablet  Every 4 hours PRN     02/20/19 1454    methocarbamol (ROBAXIN) 500 MG tablet  2 times daily     02/20/19 1454    lidocaine (LIDODERM) 5 %  Every 24 hours     02/20/19 1454           Eldrick Penick A, PA-C 02/20/19 1502    Ezequiel Essex, MD 02/20/19 1547

## 2019-02-20 NOTE — Discharge Instructions (Signed)
Take medication as prescribed.  Follow-up with orthopedics if your symptoms unresolved.  If you develop chest pain, shortness of breath, abdominal pain radiating to your back please seek reevaluation emergency department.

## 2019-02-20 NOTE — ED Triage Notes (Signed)
Pt works for [employer], states he has been lifting a lot, 2 days mid to lower back pain.

## 2019-12-02 ENCOUNTER — Emergency Department (HOSPITAL_COMMUNITY)
Admission: EM | Admit: 2019-12-02 | Discharge: 2019-12-03 | Disposition: A | Discharge status: Home / Self Care | Payer: Self-pay | Attending: Emergency Medicine | Admitting: Emergency Medicine

## 2019-12-02 ENCOUNTER — Other Ambulatory Visit: Payer: Self-pay

## 2019-12-02 ENCOUNTER — Encounter (HOSPITAL_COMMUNITY): Payer: Self-pay | Admitting: Emergency Medicine

## 2019-12-02 DIAGNOSIS — I10 Essential (primary) hypertension: Secondary | ICD-10-CM | POA: Insufficient documentation

## 2019-12-02 DIAGNOSIS — L089 Local infection of the skin and subcutaneous tissue, unspecified: Secondary | ICD-10-CM | POA: Insufficient documentation

## 2019-12-02 DIAGNOSIS — Z79899 Other long term (current) drug therapy: Secondary | ICD-10-CM | POA: Insufficient documentation

## 2019-12-02 DIAGNOSIS — F17210 Nicotine dependence, cigarettes, uncomplicated: Secondary | ICD-10-CM | POA: Insufficient documentation

## 2019-12-02 LAB — COMPREHENSIVE METABOLIC PANEL
ALT: 30 U/L (ref 0–44)
AST: 24 U/L (ref 15–41)
Albumin: 3.9 g/dL (ref 3.5–5.0)
Alkaline Phosphatase: 66 U/L (ref 38–126)
Anion gap: 12 (ref 5–15)
BUN: 10 mg/dL (ref 6–20)
CO2: 27 mmol/L (ref 22–32)
Calcium: 9.5 mg/dL (ref 8.9–10.3)
Chloride: 96 mmol/L — ABNORMAL LOW (ref 98–111)
Creatinine, Ser: 0.74 mg/dL (ref 0.61–1.24)
GFR calc Af Amer: >60 mL/min (ref >60)
GFR calc non Af Amer: >60 mL/min (ref >60)
Glucose, Bld: 270 mg/dL — ABNORMAL HIGH (ref 70–99)
Potassium: 4.2 mmol/L (ref 3.5–5.1)
Sodium: 135 mmol/L (ref 135–145)
Total Bilirubin: 1.2 mg/dL (ref 0.3–1.2)
Total Protein: 7 g/dL (ref 6.5–8.1)

## 2019-12-02 LAB — CBC WITH DIFFERENTIAL/PLATELET
Abs Immature Granulocytes: 0.04 10*3/uL (ref 0.00–0.07)
Basophils Absolute: 0.1 10*3/uL (ref 0.0–0.1)
Basophils Relative: 1 %
Eosinophils Absolute: 0.2 10*3/uL (ref 0.0–0.5)
Eosinophils Relative: 2 %
HCT: 47.3 % (ref 39.0–52.0)
Hemoglobin: 16.4 g/dL (ref 13.0–17.0)
Immature Granulocytes: 0 %
Lymphocytes Relative: 11 %
Lymphs Abs: 1 10*3/uL (ref 0.7–4.0)
MCH: 32 pg (ref 26.0–34.0)
MCHC: 34.7 g/dL (ref 30.0–36.0)
MCV: 92.2 fL (ref 80.0–100.0)
Monocytes Absolute: 0.8 10*3/uL (ref 0.1–1.0)
Monocytes Relative: 8 %
Neutro Abs: 7.7 10*3/uL (ref 1.7–7.7)
Neutrophils Relative %: 78 %
Platelets: 226 10*3/uL (ref 150–400)
RBC: 5.13 MIL/uL (ref 4.22–5.81)
RDW: 11.9 % (ref 11.5–15.5)
WBC: 9.7 10*3/uL (ref 4.0–10.5)
nRBC: 0 % (ref 0.0–0.2)

## 2019-12-02 LAB — LACTIC ACID, PLASMA: Lactic Acid, Venous: 1.6 mmol/L (ref 0.5–1.9)

## 2019-12-02 NOTE — ED Triage Notes (Signed)
Pt reports large wound to R middle finger.  States it busted on the way to hospital.  Pt believes he was bit by a spider 3-4 days.  Also reports nausea and chills.

## 2019-12-02 NOTE — ED Notes (Signed)
No answer in waiting room 

## 2019-12-03 ENCOUNTER — Emergency Department (HOSPITAL_COMMUNITY): Payer: Self-pay

## 2019-12-03 MED ORDER — IBUPROFEN 400 MG PO TABS
400.0000 mg | ORAL_TABLET | Freq: Once | ORAL | Status: AC | PRN
  Administered 2019-12-03: 400 mg via ORAL
  Filled 2019-12-03: qty 1

## 2019-12-03 MED ORDER — CLINDAMYCIN HCL 300 MG PO CAPS: 300.0000 mg | ORAL_CAPSULE | Freq: Four times a day (QID) | ORAL | 0 refills | Status: DC

## 2019-12-03 MED ORDER — HYDROCODONE-ACETAMINOPHEN 5-325 MG PO TABS
1.0000 | ORAL_TABLET | Freq: Once | ORAL | Status: AC
  Administered 2019-12-03: 1 via ORAL
  Filled 2019-12-03: qty 1

## 2019-12-03 MED ORDER — HYDROCODONE-ACETAMINOPHEN 5-325 MG PO TABS: 1.0000 | ORAL_TABLET | ORAL | 0 refills | Status: AC | PRN

## 2019-12-03 NOTE — ED Provider Notes (Signed)
[facility] EMERGENCY DEPARTMENT Provider Note   CSN: [id number] Arrival date & time: 12/02/19  1841     History Chief Complaint  Patient presents with  . Wound Infection    Alejandro Santiago is a 44 y.o. male.  HPI He is here for evaluation of right middle finger pain, with swelling and drainage for 5 days.  He thinks that he was bitten by a spider while he was moving some appliances from a storage shed.  Initially he noticed a small raised area, which gradually got worse with redness, pain, and ultimately drainage which occurred today.  He has trouble moving the affected finger.  He denies fever, chills, cough, shortness of breath, chest pain, weakness or dizziness.  He had some antibiotics, "amoxicillin and penicillin," which she took, without improvement.  No prior similar problems.  There are no other known modifying factors.    Past Medical History:  Diagnosis Date  . Hypertension     There are no problems to display for this patient.   History reviewed. No pertinent surgical history.     No family history on file.  Social History   Tobacco Use  . Smoking status: Current Every Day Smoker    Packs/day: 0.50    Types: Cigarettes  . Smokeless tobacco: Never Used  Substance Use Topics  . Alcohol use: Yes    Comment: occasionally  . Drug use: No    Home Medications Prior to Admission medications   Medication Sig Start Date End Date Taking? Authorizing Provider  benzonatate (TESSALON PERLES) 100 MG capsule Take 1 capsule (100 mg total) by mouth 3 (three) times daily as needed. Patient not taking: Reported on 12/03/2019 05/24/18   Bennie Pierini, FNP  clindamycin (CLEOCIN) 300 MG capsule Take 1 capsule (300 mg total) by mouth 4 (four) times daily. X 7 days 12/03/19   Mancel Bale, MD  HYDROcodone-acetaminophen [facility]) 5-325 MG tablet Take 1 tablet by mouth every 4 (four) hours as needed for moderate pain. 12/03/19   Mancel Bale, MD    ibuprofen (ADVIL,MOTRIN) 200 MG tablet Take 3 tablets (600 mg total) by mouth every 8 (eight) hours as needed (pain). Patient not taking: Reported on 12/03/2019 10/09/16   Caccavale, Sophia, PA-C  lidocaine (LIDODERM) 5 % Place 1 patch onto the skin daily. Remove & Discard patch within 12 hours or as directed by MD Patient not taking: Reported on 12/03/2019 02/20/19   Henderly, Britni A, PA-C  methocarbamol (ROBAXIN) 500 MG tablet Take 1 tablet (500 mg total) by mouth 2 (two) times daily. Patient not taking: Reported on 12/03/2019 02/20/19   Henderly, Britni A, PA-C  naproxen (NAPROSYN) 500 MG tablet Take 1 tablet (500 mg total) by mouth 2 (two) times daily. Patient not taking: Reported on 12/03/2019 06/28/15   Antony Madura, PA-C    Allergies    Patient has no known allergies.  Review of Systems   Review of Systems  All other systems reviewed and are negative.   Physical Exam Updated Vital Signs BP (!) 112/102 (BP Location: Right Arm)   Pulse 79   Temp 98.3 F (36.8 C) (Oral)   Resp 16   Ht 6\' 5"  (1.956 m)   Wt 83.9 kg   SpO2 95%   BMI 21.94 kg/m   Physical Exam Vitals and nursing note reviewed.  Constitutional:      General: He is not in acute distress.    Appearance: He is well-developed. He is not ill-appearing, toxic-appearing or diaphoretic.  HENT:     Head: Normocephalic and atraumatic.     Right Ear: External ear normal.     Left Ear: External ear normal.  Eyes:     Conjunctiva/sclera: Conjunctivae normal.     Pupils: Pupils are equal, round, and reactive to light.  Neck:     Trachea: Phonation normal.  Cardiovascular:     Rate and Rhythm: Normal rate.  Pulmonary:     Effort: Pulmonary effort is normal.  Abdominal:     General: There is no distension.  Musculoskeletal:     Cervical back: Normal range of motion and neck supple.     Comments: Right finger 3 tenderness swelling dorsal base, with open area and mild active drainage.  Swelling does not appear to  involve either the MCP or PIP joints.  Neurovascular intact distally in the fingertip, no proximal streaking or tenderness along the tendon sheath.  Skin:    General: Skin is warm and dry.  Neurological:     Mental Status: He is alert and oriented to person, place, and time.     Cranial Nerves: No cranial nerve deficit.     Sensory: No sensory deficit.     Motor: No abnormal muscle tone.     Coordination: Coordination normal.  Psychiatric:        Mood and Affect: Mood normal.        Behavior: Behavior normal.        Thought Content: Thought content normal.        Judgment: Judgment normal.       ED Results / Procedures / Treatments   Labs (all labs ordered are listed, but only abnormal results are displayed) Labs Reviewed  COMPREHENSIVE METABOLIC PANEL - Abnormal; Notable for the following components:      Result Value   Chloride 96 (*)    Glucose, Bld 270 (*)    All other components within normal limits  LACTIC ACID, PLASMA  CBC WITH DIFFERENTIAL/PLATELET    EKG None  Radiology DG Hand 2 View Right  Result Date: 12/03/2019 CLINICAL DATA:  Insect bite EXAM: RIGHT HAND - 2 VIEW COMPARISON:  None. FINDINGS: There is no evidence of fracture or dislocation. There is diffuse soft tissue swelling seen around the proximal third phalanx. No areas of cortical destruction or radiopaque foreign body. IMPRESSION: Significant diffuse soft tissue swelling around the proximal third phalanx. No acute osseous abnormality. Electronically Signed   By: Jonna Clark M.D.   On: 12/03/2019 01:57    Procedures Procedures (including critical care time)  Medications Ordered in ED Medications  ibuprofen (ADVIL) tablet 400 mg (400 mg Oral Given 12/03/19 0131)  HYDROcodone-acetaminophen (NORCO/VICODIN) 5-325 MG per tablet 1 tablet (1 tablet Oral Given 12/03/19 2353)    ED Course  I have reviewed the triage vital signs and the nursing notes.  Pertinent labs & imaging results that were available  during my care of the patient were reviewed by me and considered in my medical decision making (see chart for details).    MDM Rules/Calculators/A&P                           Patient Vitals for the past 24 hrs:  BP Temp Temp src Pulse Resp SpO2 Height Weight  12/03/19 0801 (!) 112/102 98.3 F (36.8 C) Oral 79 16 95 % -- --  12/03/19 0534 127/79 98 F (36.7 C) Oral 81 16 99 % -- --  12/03/19 0305  120/80 98.3 F (36.8 C) Oral 79 16 99 % -- --  12/02/19 2350 132/85 98.4 F (36.9 C) Oral 87 16 98 % -- --  12/02/19 1930 -- -- -- -- -- -- 6\' 5"  (1.956 m) 83.9 kg  12/02/19 1929 (!) 141/99 98.3 F (36.8 C) -- (!) 106 16 98 % -- --    9:48 AM Reevaluation with update and discussion. After initial assessment and treatment, an updated evaluation reveals no change in clinical status, findings discussed with the patient and all questions were answered. 02/01/20   Medical Decision Making:  This patient is presenting for evaluation of finger swelling with drainage, which does require a range of treatment options, and is a complaint that involves a moderate risk of morbidity and mortality. The differential diagnoses include cellulitis, tendinitis, local wound infection, envenomation. I decided to review old records, and in summary healthy male presenting with 5-day history of pain and swelling of finger, with concern for spider bite.  I did not require additional historical information from anyone.  Clinical Laboratory Tests Ordered, included CBC and Metabolic panel. Review indicates normal findings. Radiologic Tests Ordered, included right hand.  I independently Visualized: Radiographs, which show no fracture, osteomyelitis, or foreign body.    Critical Interventions-clinical evaluation, laboratory testing, radiography, observation reassessment  After These Interventions, the Patient was reevaluated and was found with probable infection, and possible envenomation/minimal tissue necrosis.   Patient has been partially treated with antibiotic, without resolution, medicine that he had at home.  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  Wound of finger appears to be draining, with only minimal to no tissue loss expected.  No indication for immediate intervention.  Patient will be covered with antibiotics referred to hand/plastic surgery, for ongoing management of this wound.  I suspect that he will improve with antibiotics, warm soaks, and wound care.  I do not think that there is joint involvement, of the fingers or tendinopathy at this time.  No indication for hospitalization.  CRITICAL CARE-no Performed by: Mancel Bale  Nursing Notes Reviewed/ Care Coordinated Applicable Imaging Reviewed Interpretation of Laboratory Data incorporated into ED treatment  The patient appears reasonably screened and/or stabilized for discharge and I doubt any other medical condition or other [facility] requiring further screening, evaluation, or treatment in the ED at this time prior to discharge.  Plan: Home Medications-OTC analgesia of choice; Home Treatments-warm soaks 4 times daily, wound care with soap and water; return here if the recommended treatment, does not improve the symptoms; Recommended follow up-follow-up hand surgery, as soon as possible for further evaluation treatment.  Work release until seen by hand surgery.     Final Clinical Impression(s) / ED Diagnoses Final diagnoses:  Finger infection    Rx / DC Orders ED Discharge Orders         Ordered    HYDROcodone-acetaminophen (NORCO) 5-325 MG tablet  Every 4 hours PRN        12/03/19 0946    clindamycin (CLEOCIN) 300 MG capsule  4 times daily        12/03/19 0946           02/02/20, MD 12/03/19 [phone]

## 2019-12-03 NOTE — Discharge Instructions (Signed)
At this time it appears that there is infection in the finger, possibly caused by a spider bite. To treat this we are prescribing an antibiotic, clindamycin. It will also help to soak the finger in warm water for about 30 minutes 4 times a day. Also make sure you clean the finger well with soap and water frequently. You can apply a light bandage to it to help control any drainage. Try to elevate your finger above your heart is much as possible. Call the hand surgeon, Dr. Kristine Linea to follow-up on the infection as soon as possible. He may need to do some additional treatment. Return here, if needed, for problems.

## 2022-07-09 ENCOUNTER — Inpatient Hospital Stay (HOSPITAL_COMMUNITY)
Admission: EM | Admit: 2022-07-09 | Discharge: 2022-07-18 | DRG: 853 | Disposition: A | Discharge status: Other Acute Inpatient Hospital | Payer: Medicaid Other | Attending: Internal Medicine | Admitting: Internal Medicine

## 2022-07-09 ENCOUNTER — Emergency Department (HOSPITAL_COMMUNITY): Payer: Medicaid Other

## 2022-07-09 ENCOUNTER — Other Ambulatory Visit: Payer: Self-pay

## 2022-07-09 ENCOUNTER — Encounter (HOSPITAL_COMMUNITY): Payer: Self-pay | Admitting: Emergency Medicine

## 2022-07-09 DIAGNOSIS — N289 Disorder of kidney and ureter, unspecified: Secondary | ICD-10-CM | POA: Diagnosis present

## 2022-07-09 DIAGNOSIS — F1410 Cocaine abuse, uncomplicated: Secondary | ICD-10-CM | POA: Diagnosis present

## 2022-07-09 DIAGNOSIS — E876 Hypokalemia: Secondary | ICD-10-CM | POA: Diagnosis not present

## 2022-07-09 DIAGNOSIS — I10 Essential (primary) hypertension: Secondary | ICD-10-CM | POA: Diagnosis present

## 2022-07-09 DIAGNOSIS — H3092 Unspecified chorioretinal inflammation, left eye: Secondary | ICD-10-CM | POA: Diagnosis not present

## 2022-07-09 DIAGNOSIS — R3912 Poor urinary stream: Secondary | ICD-10-CM | POA: Diagnosis present

## 2022-07-09 DIAGNOSIS — E871 Hypo-osmolality and hyponatremia: Secondary | ICD-10-CM | POA: Diagnosis present

## 2022-07-09 DIAGNOSIS — B356 Tinea cruris: Secondary | ICD-10-CM | POA: Diagnosis not present

## 2022-07-09 DIAGNOSIS — Z79899 Other long term (current) drug therapy: Secondary | ICD-10-CM | POA: Diagnosis not present

## 2022-07-09 DIAGNOSIS — A4102 Sepsis due to Methicillin resistant Staphylococcus aureus: Principal | ICD-10-CM | POA: Diagnosis present

## 2022-07-09 DIAGNOSIS — L080 Pyoderma: Secondary | ICD-10-CM | POA: Diagnosis present

## 2022-07-09 DIAGNOSIS — Z794 Long term (current) use of insulin: Secondary | ICD-10-CM | POA: Diagnosis not present

## 2022-07-09 DIAGNOSIS — I2690 Septic pulmonary embolism without acute cor pulmonale: Secondary | ICD-10-CM | POA: Diagnosis not present

## 2022-07-09 DIAGNOSIS — K5900 Constipation, unspecified: Secondary | ICD-10-CM | POA: Diagnosis not present

## 2022-07-09 DIAGNOSIS — N412 Abscess of prostate: Secondary | ICD-10-CM | POA: Diagnosis present

## 2022-07-09 DIAGNOSIS — I38 Endocarditis, valve unspecified: Secondary | ICD-10-CM | POA: Diagnosis not present

## 2022-07-09 DIAGNOSIS — Z597 Insufficient social insurance and welfare support: Secondary | ICD-10-CM | POA: Diagnosis not present

## 2022-07-09 DIAGNOSIS — E119 Type 2 diabetes mellitus without complications: Secondary | ICD-10-CM | POA: Diagnosis not present

## 2022-07-09 DIAGNOSIS — E1165 Type 2 diabetes mellitus with hyperglycemia: Secondary | ICD-10-CM | POA: Diagnosis present

## 2022-07-09 DIAGNOSIS — N3090 Cystitis, unspecified without hematuria: Secondary | ICD-10-CM | POA: Diagnosis present

## 2022-07-09 DIAGNOSIS — N419 Inflammatory disease of prostate, unspecified: Principal | ICD-10-CM

## 2022-07-09 DIAGNOSIS — I330 Acute and subacute infective endocarditis: Secondary | ICD-10-CM | POA: Diagnosis not present

## 2022-07-09 DIAGNOSIS — F17210 Nicotine dependence, cigarettes, uncomplicated: Secondary | ICD-10-CM | POA: Diagnosis present

## 2022-07-09 DIAGNOSIS — R918 Other nonspecific abnormal finding of lung field: Secondary | ICD-10-CM | POA: Diagnosis present

## 2022-07-09 DIAGNOSIS — B9562 Methicillin resistant Staphylococcus aureus infection as the cause of diseases classified elsewhere: Secondary | ICD-10-CM | POA: Diagnosis not present

## 2022-07-09 DIAGNOSIS — R109 Unspecified abdominal pain: Secondary | ICD-10-CM | POA: Diagnosis present

## 2022-07-09 LAB — CBC WITH DIFFERENTIAL/PLATELET
Abs Immature Granulocytes: 0.11 10*3/uL — ABNORMAL HIGH (ref 0.00–0.07)
Basophils Absolute: 0.1 10*3/uL (ref 0.0–0.1)
Basophils Relative: 0 %
Eosinophils Absolute: 0.2 10*3/uL (ref 0.0–0.5)
Eosinophils Relative: 1 %
HCT: 44.3 % (ref 39.0–52.0)
Hemoglobin: 15.9 g/dL (ref 13.0–17.0)
Immature Granulocytes: 1 %
Lymphocytes Relative: 7 %
Lymphs Abs: 1.1 10*3/uL (ref 0.7–4.0)
MCH: 31.5 pg (ref 26.0–34.0)
MCHC: 35.9 g/dL (ref 30.0–36.0)
MCV: 87.9 fL (ref 80.0–100.0)
Monocytes Absolute: 1 10*3/uL (ref 0.1–1.0)
Monocytes Relative: 6 %
Neutro Abs: 14.4 10*3/uL — ABNORMAL HIGH (ref 1.7–7.7)
Neutrophils Relative %: 85 %
Platelets: 396 10*3/uL (ref 150–400)
RBC: 5.04 MIL/uL (ref 4.22–5.81)
RDW: 12.1 % (ref 11.5–15.5)
WBC: 16.8 10*3/uL — ABNORMAL HIGH (ref 4.0–10.5)
nRBC: 0 % (ref 0.0–0.2)

## 2022-07-09 LAB — COMPREHENSIVE METABOLIC PANEL
ALT: 12 U/L (ref 0–44)
AST: 17 U/L (ref 15–41)
Albumin: 2.7 g/dL — ABNORMAL LOW (ref 3.5–5.0)
Alkaline Phosphatase: 77 U/L (ref 38–126)
Anion gap: 11 (ref 5–15)
BUN: 7 mg/dL (ref 6–20)
CO2: 31 mmol/L (ref 22–32)
Calcium: 8.5 mg/dL — ABNORMAL LOW (ref 8.9–10.3)
Chloride: 91 mmol/L — ABNORMAL LOW (ref 98–111)
Creatinine, Ser: 0.7 mg/dL (ref 0.61–1.24)
GFR, Estimated: >60 mL/min (ref >60)
Glucose, Bld: 397 mg/dL — ABNORMAL HIGH (ref 70–99)
Potassium: 3.5 mmol/L (ref 3.5–5.1)
Sodium: 133 mmol/L — ABNORMAL LOW (ref 135–145)
Total Bilirubin: 1.1 mg/dL (ref 0.3–1.2)
Total Protein: 7.4 g/dL (ref 6.5–8.1)

## 2022-07-09 LAB — CBG MONITORING, ED
Glucose-Capillary: 430 mg/dL — ABNORMAL HIGH (ref 70–99)
Glucose-Capillary: 418 mg/dL — ABNORMAL HIGH (ref 70–99)
Glucose-Capillary: 255 mg/dL — ABNORMAL HIGH (ref 70–99)

## 2022-07-09 LAB — CBC
HCT: 37.7 % — ABNORMAL LOW (ref 39.0–52.0)
Hemoglobin: 13.2 g/dL (ref 13.0–17.0)
MCH: 31.4 pg (ref 26.0–34.0)
MCHC: 35 g/dL (ref 30.0–36.0)
MCV: 89.5 fL (ref 80.0–100.0)
Platelets: 339 10*3/uL (ref 150–400)
RBC: 4.21 MIL/uL — ABNORMAL LOW (ref 4.22–5.81)
RDW: 12.2 % (ref 11.5–15.5)
WBC: 17.8 10*3/uL — ABNORMAL HIGH (ref 4.0–10.5)
nRBC: 0 % (ref 0.0–0.2)

## 2022-07-09 LAB — URINALYSIS, ROUTINE W REFLEX MICROSCOPIC
Bacteria, UA: NONE SEEN
Bilirubin Urine: NEGATIVE
Glucose, UA: >=500 mg/dL — AB
Ketones, ur: NEGATIVE mg/dL
Nitrite: POSITIVE — AB
Protein, ur: NEGATIVE mg/dL
Specific Gravity, Urine: 1.029 (ref 1.005–1.030)
WBC, UA: >50 WBC/hpf (ref 0–5)
pH: 7 (ref 5.0–8.0)

## 2022-07-09 LAB — HEMOGLOBIN A1C
Hgb A1c MFr Bld: 9.9 % — ABNORMAL HIGH (ref 4.8–5.6)
Mean Plasma Glucose: 237.43 mg/dL

## 2022-07-09 LAB — RAPID URINE DRUG SCREEN, HOSP PERFORMED
Amphetamines: NOT DETECTED
Barbiturates: NOT DETECTED
Benzodiazepines: NOT DETECTED
Cocaine: POSITIVE — AB
Opiates: NOT DETECTED
Tetrahydrocannabinol: NOT DETECTED

## 2022-07-09 LAB — ETHANOL: Alcohol, Ethyl (B): <10 mg/dL (ref <10)

## 2022-07-09 LAB — CREATININE, SERUM
Creatinine, Ser: 0.74 mg/dL (ref 0.61–1.24)
GFR, Estimated: >60 mL/min (ref >60)

## 2022-07-09 LAB — BLOOD GAS, VENOUS
Acid-Base Excess: 9.8 mmol/L — ABNORMAL HIGH (ref 0.0–2.0)
Bicarbonate: 33.4 mmol/L — ABNORMAL HIGH (ref 20.0–28.0)
O2 Saturation: 94.2 %
Patient temperature: 37
pCO2, Ven: 40 mmHg — ABNORMAL LOW (ref 44–60)
pH, Ven: 7.53 — ABNORMAL HIGH (ref 7.25–7.43)
pO2, Ven: 62 mmHg — ABNORMAL HIGH (ref 32–45)

## 2022-07-09 LAB — BETA-HYDROXYBUTYRIC ACID
Beta-Hydroxybutyric Acid: 0.18 mmol/L (ref 0.05–0.27)
Beta-Hydroxybutyric Acid: <0.05 mmol/L — ABNORMAL LOW (ref 0.05–0.27)

## 2022-07-09 LAB — LIPASE, BLOOD: Lipase: 32 U/L (ref 11–51)

## 2022-07-09 MED ORDER — ACETAMINOPHEN 325 MG PO TABS
650.0000 mg | ORAL_TABLET | Freq: Four times a day (QID) | ORAL | Status: DC | PRN
  Administered 2022-07-17 – 2022-07-18 (×4): 650 mg via ORAL
  Filled 2022-07-09 (×4): qty 2

## 2022-07-09 MED ORDER — HYDROMORPHONE HCL 1 MG/ML IJ SOLN
1.0000 mg | Freq: Once | INTRAMUSCULAR | Status: AC
  Administered 2022-07-09: 1 mg via INTRAVENOUS
  Filled 2022-07-09: qty 1

## 2022-07-09 MED ORDER — MORPHINE SULFATE (PF) 2 MG/ML IV SOLN
2.0000 mg | INTRAVENOUS | Status: DC | PRN
  Administered 2022-07-09 – 2022-07-10 (×3): 2 mg via INTRAVENOUS
  Filled 2022-07-09 (×3): qty 1

## 2022-07-09 MED ORDER — ONDANSETRON HCL 4 MG PO TABS: 4.0000 mg | ORAL_TABLET | Freq: Four times a day (QID) | ORAL | Status: DC | PRN

## 2022-07-09 MED ORDER — SODIUM CHLORIDE 0.9 % IV SOLN
2.0000 g | Freq: Once | INTRAVENOUS | Status: AC
  Administered 2022-07-09: 2 g via INTRAVENOUS
  Filled 2022-07-09: qty 20

## 2022-07-09 MED ORDER — HYDROCODONE-ACETAMINOPHEN 5-325 MG PO TABS
1.0000 | ORAL_TABLET | ORAL | Status: DC | PRN
  Administered 2022-07-09 – 2022-07-10 (×2): 2 via ORAL
  Administered 2022-07-10: 1 via ORAL
  Administered 2022-07-10 – 2022-07-11 (×5): 2 via ORAL
  Filled 2022-07-09 (×8): qty 2

## 2022-07-09 MED ORDER — ACETAMINOPHEN 650 MG RE SUPP: 650.0000 mg | Freq: Four times a day (QID) | RECTAL | Status: DC | PRN

## 2022-07-09 MED ORDER — LACTATED RINGERS IV BOLUS
1000.0000 mL | INTRAVENOUS | Status: AC
  Administered 2022-07-09 (×2): 1000 mL via INTRAVENOUS

## 2022-07-09 MED ORDER — IOHEXOL 300 MG/ML  SOLN
100.0000 mL | Freq: Once | INTRAMUSCULAR | Status: AC | PRN
  Administered 2022-07-09: 100 mL via INTRAVENOUS

## 2022-07-09 MED ORDER — SODIUM CHLORIDE 0.9 % IV SOLN: INTRAVENOUS | Status: DC

## 2022-07-09 MED ORDER — ENOXAPARIN SODIUM 40 MG/0.4ML IJ SOSY
40.0000 mg | PREFILLED_SYRINGE | INTRAMUSCULAR | Status: DC
  Administered 2022-07-10 – 2022-07-18 (×9): 40 mg via SUBCUTANEOUS
  Filled 2022-07-09 (×9): qty 0.4

## 2022-07-09 MED ORDER — METRONIDAZOLE 500 MG/100ML IV SOLN
500.0000 mg | Freq: Three times a day (TID) | INTRAVENOUS | Status: DC
  Administered 2022-07-09: 500 mg via INTRAVENOUS
  Filled 2022-07-09: qty 100

## 2022-07-09 MED ORDER — ONDANSETRON HCL 4 MG/2ML IJ SOLN
4.0000 mg | Freq: Four times a day (QID) | INTRAMUSCULAR | Status: DC | PRN
  Administered 2022-07-10: 4 mg via INTRAVENOUS

## 2022-07-09 MED ORDER — METRONIDAZOLE 500 MG/100ML IV SOLN
500.0000 mg | Freq: Two times a day (BID) | INTRAVENOUS | Status: DC
  Administered 2022-07-10: 500 mg via INTRAVENOUS
  Filled 2022-07-09: qty 100

## 2022-07-09 MED ORDER — NICOTINE 14 MG/24HR TD PT24
14.0000 mg | MEDICATED_PATCH | TRANSDERMAL | Status: DC
  Administered 2022-07-09: 14 mg via TRANSDERMAL
  Filled 2022-07-09 (×5): qty 1

## 2022-07-09 MED ORDER — SODIUM CHLORIDE 0.9 % IV SOLN
2.0000 g | INTRAVENOUS | Status: DC
  Administered 2022-07-10 – 2022-07-11 (×2): 2 g via INTRAVENOUS
  Filled 2022-07-09 (×2): qty 20

## 2022-07-09 MED ORDER — INSULIN ASPART 100 UNIT/ML IJ SOLN
5.0000 [IU] | Freq: Once | INTRAMUSCULAR | Status: AC
  Administered 2022-07-09: 5 [IU] via SUBCUTANEOUS
  Filled 2022-07-09: qty 0.05

## 2022-07-09 MED ORDER — SODIUM CHLORIDE (PF) 0.9 % IJ SOLN
INTRAMUSCULAR | Status: AC
  Filled 2022-07-09: qty 50

## 2022-07-09 MED ORDER — INSULIN ASPART 100 UNIT/ML IJ SOLN
0.0000 [IU] | Freq: Three times a day (TID) | INTRAMUSCULAR | Status: DC
  Administered 2022-07-09: 8 [IU] via SUBCUTANEOUS
  Administered 2022-07-10: 5 [IU] via SUBCUTANEOUS
  Administered 2022-07-10: 15 [IU] via SUBCUTANEOUS
  Administered 2022-07-10: 5 [IU] via SUBCUTANEOUS
  Administered 2022-07-11: 3 [IU] via SUBCUTANEOUS
  Administered 2022-07-11 (×2): 8 [IU] via SUBCUTANEOUS
  Administered 2022-07-12 (×2): 3 [IU] via SUBCUTANEOUS
  Administered 2022-07-12 – 2022-07-13 (×2): 5 [IU] via SUBCUTANEOUS
  Administered 2022-07-13: 8 [IU] via SUBCUTANEOUS
  Administered 2022-07-13: 5 [IU] via SUBCUTANEOUS
  Administered 2022-07-14: 11 [IU] via SUBCUTANEOUS
  Administered 2022-07-14: 8 [IU] via SUBCUTANEOUS
  Administered 2022-07-14: 5 [IU] via SUBCUTANEOUS
  Administered 2022-07-15 (×2): 8 [IU] via SUBCUTANEOUS
  Administered 2022-07-15: 11 [IU] via SUBCUTANEOUS
  Administered 2022-07-16: 3 [IU] via SUBCUTANEOUS
  Administered 2022-07-16: 8 [IU] via SUBCUTANEOUS
  Administered 2022-07-17 (×2): 5 [IU] via SUBCUTANEOUS
  Administered 2022-07-17 – 2022-07-18 (×2): 2 [IU] via SUBCUTANEOUS
  Administered 2022-07-18: 8 [IU] via SUBCUTANEOUS
  Administered 2022-07-18: 3 [IU] via SUBCUTANEOUS
  Filled 2022-07-09: qty 0.15

## 2022-07-09 NOTE — H&P (Signed)
History and Physical    Alejandro Santiago  ZOX:[id number]  DOB: 26-Apr-1975  DOA: 07/09/2022 PCP: Patient, No Pcp Per   Patient coming from: pain in penis, shortness of breath  Chief Complaint: Pain in his "urethra".  HPI: Alejandro Santiago is a 47 y.o. male with no medical history who began to have pain in his "urethra" about 3 days ago. He has dysuria. No hematuria. He has had sweats and chills. Pain is currently in scrotum and anus and is 10/10 right now.  No discharge. He has one male partner over the last 12 years and this is his girlfriend. No h/o STDs.  He is also noted to have elevated glucose levels and is not aware that he is a diabetic.  As far as he knows, he does not have any medical problems and does not have a PCP  Also noted on exam are numerous maculopapular lesions which she states started about 2 weeks ago.  He states that he does not use any IV drugs.  ED Course:  CT abdomen pelvis-multiloculated rim-enhancing fluid collections within the prostate gland measuring up to 5.7 x 3.3 cm - Circumferential mural thickening and pericystic stranding suggestive of cystitis - Thick-walled hypoenhancing exophytic lesion in the left kidney suspicious for primary renal neoplasm - Left lower lobe groundglass nodule with additional scattered solid nodules in bilateral lower lobes  Sodium 133, chloride 91 WBC 16.8 Hemoglobin A1c 9.9 UA reveals WBC, nitrites, ketones and glucose but no bacteria noted  Cocaine positive  Review of Systems:  Admits to cocaine use but no other drug use All other systems reviewed and apart from HPI, are negative.  Past Medical History:  Diagnosis Date   Hypertension     History reviewed. No pertinent surgical history.  Social History:   reports that he has been smoking cigarettes. He has been smoking an average of .5 packs per day. He has never used smokeless tobacco. He reports current alcohol use. He reports that he does not use drugs. Cocaine  use- about 3-4 days ago- snorts - uses maybe once a month ETOH but not every day  No Known Allergies  Family history- no diabetes   Prior to Admission medications   Medication Sig Start Date End Date Taking? Authorizing Provider  benzonatate (TESSALON PERLES) 100 MG capsule Take 1 capsule (100 mg total) by mouth 3 (three) times daily as needed. Patient not taking: Reported on 12/03/2019 05/24/18   Bennie Pierini, FNP  clindamycin (CLEOCIN) 300 MG capsule Take 1 capsule (300 mg total) by mouth 4 (four) times daily. X 7 days 12/03/19   Mancel Bale, MD  HYDROcodone-acetaminophen [facility]) 5-325 MG tablet Take 1 tablet by mouth every 4 (four) hours as needed for moderate pain. 12/03/19   Mancel Bale, MD  ibuprofen (ADVIL,MOTRIN) 200 MG tablet Take 3 tablets (600 mg total) by mouth every 8 (eight) hours as needed (pain). Patient not taking: Reported on 12/03/2019 10/09/16   Caccavale, Sophia, PA-C  lidocaine (LIDODERM) 5 % Place 1 patch onto the skin daily. Remove & Discard patch within 12 hours or as directed by MD Patient not taking: Reported on 12/03/2019 02/20/19   Henderly, Britni A, PA-C  methocarbamol (ROBAXIN) 500 MG tablet Take 1 tablet (500 mg total) by mouth 2 (two) times daily. Patient not taking: Reported on 12/03/2019 02/20/19   Henderly, Britni A, PA-C  naproxen (NAPROSYN) 500 MG tablet Take 1 tablet (500 mg total) by mouth 2 (two) times daily. Patient not taking: Reported on 12/03/2019  06/28/15   Antony Madura, PA-C    Physical Exam: Wt Readings from Last 3 Encounters:  07/09/22 72.6 kg  12/02/19 83.9 kg  02/20/19 90.7 kg   Vitals:   07/09/22 1145 07/09/22 1430 07/09/22 1527 07/09/22 1630  BP: (!) 143/93 (!) 161/100  (!) 164/97  Pulse: 86 90  91  Resp: (!) 23 13  16   Temp:   98 F (36.7 C)   TempSrc:   Oral   SpO2: 98% 95%  95%  Weight:      Height:          Constitutional: Appears uncomfortable Eyes: PERRLA, lids and conjunctivae normal ENT:  Mucous  membranes are dry Pharynx clear of exudate   Normal dentition.  Respiratory:  Clear to auscultation bilaterally  Normal respiratory effort.  Cardiovascular:  S1 & S2 heard, regular rate and rhythm No Murmurs Abdomen:  Non distended No tenderness, No masses Bowel sounds normal GU: Tender at the base of the penis-no ulcers and no discharge noted Extremities:  No clubbing / cyanosis No pedal edema  Skin:  Extensive maculopapular lesions, some excoriations-no drainage noted from any of these lesions Neurologic:  AAO x 3 CN 2-12 grossly intact Sensation intact Strength 5/5 in all 4 extremities Psychiatric:  Normal Mood and affect    Labs on Admission: I have personally reviewed following labs and imaging studies  CBC: Recent Labs  Lab 07/09/22 1122  WBC 16.8*  NEUTROABS 14.4*  HGB 15.9  HCT 44.3  MCV 87.9  PLT 396   Basic Metabolic Panel: Recent Labs  Lab 07/09/22 1248  NA 133*  K 3.5  CL 91*  CO2 31  GLUCOSE 397*  BUN 7  CREATININE 0.70  CALCIUM 8.5*   GFR: Estimated Creatinine Clearance: 117.2 mL/min (by C-G formula based on SCr of 0.7 mg/dL). Liver Function Tests: Recent Labs  Lab 07/09/22 1248  AST 17  ALT 12  ALKPHOS 77  BILITOT 1.1  PROT 7.4  ALBUMIN 2.7*   Recent Labs  Lab 07/09/22 1248  LIPASE 32   No results for input(s): "AMMONIA" in the last 168 hours. Coagulation Profile: No results for input(s): "INR", "PROTIME" in the last 168 hours. Cardiac Enzymes: No results for input(s): "CKTOTAL", "CKMB", "CKMBINDEX", "TROPONINI" in the last 168 hours. BNP (last 3 results) No results for input(s): "PROBNP" in the last 8760 hours. HbA1C: Recent Labs    07/09/22 1122  HGBA1C 9.9*   CBG: Recent Labs  Lab 07/09/22 1109 07/09/22 1240 07/09/22 1629  GLUCAP 430* 418* 255*   Lipid Profile: No results for input(s): "CHOL", "HDL", "LDLCALC", "TRIG", "CHOLHDL", "LDLDIRECT" in the last 72 hours. Thyroid Function Tests: No results for  input(s): "TSH", "T4TOTAL", "FREET4", "T3FREE", "THYROIDAB" in the last 72 hours. Anemia Panel: No results for input(s): "VITAMINB12", "FOLATE", "FERRITIN", "TIBC", "IRON", "RETICCTPCT" in the last 72 hours. Urine analysis:    Component Value Date/Time   COLORURINE STRAW (A) 07/09/2022 1238   APPEARANCEUR HAZY (A) 07/09/2022 1238   LABSPEC 1.029 07/09/2022 1238   PHURINE 7.0 07/09/2022 1238   GLUCOSEU >=500 (A) 07/09/2022 1238   HGBUR SMALL (A) 07/09/2022 1238   BILIRUBINUR NEGATIVE 07/09/2022 1238   KETONESUR NEGATIVE 07/09/2022 1238   PROTEINUR NEGATIVE 07/09/2022 1238   NITRITE POSITIVE (A) 07/09/2022 1238   LEUKOCYTESUR MODERATE (A) 07/09/2022 1238   Sepsis Labs: @LABRCNTIP (procalcitonin:4,lacticidven:4) )No results found for this or any previous visit (from the past 240 hour(s)).   Radiological Exams on Admission: CT ABDOMEN PELVIS W CONTRAST  Result Date: 07/09/2022 CLINICAL DATA:  Anterior inferior abdominal pain for evaluation of pyelonephritis EXAM: CT ABDOMEN AND PELVIS WITH CONTRAST TECHNIQUE: Multidetector CT imaging of the abdomen and pelvis was performed using the standard protocol following bolus administration of intravenous contrast. RADIATION DOSE REDUCTION: This exam was performed according to the departmental dose-optimization program which includes automated exposure control, adjustment of the mA and/or kV according to patient size and/or use of iterative reconstruction technique. CONTRAST:  OMNIPAQUE IOHEXOL 300 MG/ML  SOLN COMPARISON:  None Available. FINDINGS: Lower chest: Left lower lobe ground-glass nodule measures 9 x 7 mm (5:29). Additional ill-defined ground-glass density on series 5, image 18. Additional scattered solid nodules measuring up to 3 mm in the right (5:17) and left lower lobes (5:8,9). Triangular right middle lobe perifissural nodule (5:8) is most often an intrapulmonary lymph node. No specific follow-up imaging recommended. No pleural effusion  or pneumothorax demonstrated. Partially imaged heart size is normal. Hepatobiliary: Hypoattenuation along the falciform ligament may reflect focal steatosis or perfusional variation. No intra or extrahepatic biliary ductal dilation. Normal gallbladder. Pancreas: No focal lesions or main ductal dilation. Spleen: Enlarged in the AP dimension measures 14.9 cm. No focal lesions. Adrenals/Urinary Tract: Nodular thickening of the right adrenal gland without discrete nodule. Left adrenal nodule. Thick-walled hypoenhancing exophytic lesion along the anterolateral left interpolar kidney measures 1.4 x 1.0 cm (2:38). No hydronephrosis or stones. Circumferential mural thickening and pericystic stranding. Stomach/Bowel: Normal appearance of the stomach. No evidence of bowel wall thickening, distention, or inflammatory changes. Normal appendix. Vascular/Lymphatic: No significant vascular findings are present. No enlarged abdominal or pelvic lymph nodes. Reproductive: Multiloculated rim enhancing fluid collections within the prostate gland measuring up to 5.7 x 3.3 cm in conglomerate (2:90). Other: No free fluid, fluid collection, or free air. Musculoskeletal: No acute or abnormal lytic or blastic osseous lesions. IMPRESSION: 1. Multiloculated rim enhancing fluid collections within the prostate gland measuring up to 5.7 x 3.3 cm in conglomerate, suspicious for abscesses. Cystic degeneration of BPH nodule and cystic neoplasm are considered much less likely in the setting of acute urinary tract infection. 2. Circumferential mural thickening and pericystic stranding, suggestive of cystitis. 3. Thick-walled hypoenhancing exophytic lesion along the anterolateral left interpolar kidney measures 1.4 x 1.0 cm, suspicious for hypoenhancing primary renal neoplasm. Recommend further evaluation with nonemergent renal mass protocol MRI or CT. 4. Left lower lobe ground-glass nodule measures 9 x 7 mm, likely infectious/inflammatory. Additional  scattered solid nodules measuring up to 3 mm in the bilateral lower lobes. Non-contrast chest CT at 3-6 months is recommended. If nodules persist, subsequent management will be based upon the most suspicious nodule(s). This recommendation follows the consensus statement: Guidelines for Management of Incidental Pulmonary Nodules Detected on CT Images: From the Fleischner Society 2017; Radiology 2017; 284:228-243. 5. Splenomegaly. Electronically Signed   By: Agustin Cree M.D.   On: 07/09/2022 16:34   DG Chest 2 View  Result Date: 07/09/2022 CLINICAL DATA:  Five day history of shortness of breath EXAM: CHEST - 2 VIEW COMPARISON:  Chest radiograph dated 06/28/2015 FINDINGS: Low lung volumes. No focal consolidations. No pleural effusion or pneumothorax. The heart size and mediastinal contours are within normal limits. No acute osseous abnormality. IMPRESSION: No active cardiopulmonary disease. Electronically Signed   By: Agustin Cree M.D.   On: 07/09/2022 12:10    EKG: Independently reviewed. NSR  Assessment/Plan Principal Problem:   Prostate abscesses -Per ED, urology has been consulted and according to the patient he was just seen by  the urologist prior to my evaluation - Continue ceftriaxone and Flagyl - Norco and as needed morphine for pain - Check for HIV, chlamydia and gonorrhea - Rule out renal cancer  Active Problems:   Diabetes mellitus type 2 in nonobese (HCC) -Not in DKA -Start insulin sliding scale, diabetic diet - Consult diabetes coordinator and dietitian  Extensive maculopapular rash - bacteremia? -Interestingly the rash is not on his back but is on all other parts of the body and therefore may be related to scratching  Nicotine abuse -Smokes half a pack a day - Nicotine patch ordered  Cocaine abuse - Counseled - She does not admit to any other illicit drug use  Lung nodules - Will need to follow-up as outpatient  Hyponatremia - He admits to poor oral intake over the past  couple of days-start IV fluids and follow  DVT prophylaxis: lovenox  Code Status: Full code  Consults called: Urology called by ED  Admission status:  Level of care: Med-Surg  Calvert Cantor MD [facility]    07/09/2022, 5:57 PM

## 2022-07-09 NOTE — ED Notes (Signed)
Pt made aware of needing UA sample. Urinal at bedside

## 2022-07-09 NOTE — Inpatient Diabetes Management (Signed)
Inpatient Diabetes Program Recommendations  AACE/ADA: New Consensus Statement on Inpatient Glycemic Control (2015)  Target Ranges:  Prepandial:   less than 140 mg/dL      Peak postprandial:   less than 180 mg/dL (1-2 hours)      Critically ill patients:  140 - 180 mg/dL    Latest Reference Range & Units 07/09/22 12:48  Sodium 135 - 145 mmol/L 133 (L)  Potassium 3.5 - 5.1 mmol/L 3.5  Chloride 98 - 111 mmol/L 91 (L)  CO2 22 - 32 mmol/L 31  Glucose 70 - 99 mg/dL 161 (H)  BUN 6 - 20 mg/dL 7  Creatinine 0.96 - 0.45 mg/dL 4.09  Calcium 8.9 - 81.1 mg/dL 8.5 (L)  Anion gap 5 - 15  11  (L): Data is abnormally low (H): Data is abnormally high   To ED with Hyperglycemia/ Abd Pain Patient reports he has had weight loss for the past 2 to 3 months. He reports for the past 5 days he has had fatigue, vomiting, excessive thirst, urination, constipation, cough, shortness of breath. Patient does not see a doctor regularly.  He denies any known medical issues including diabetes. 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    A1c ordered--Not sure how long the turn around time will be for this Lab  Novolog 5 units X 1 dose to be given this afternoon  Needs PCP--No insurance noted--No regular health care--Placed TOC Consult   Met w/ pt briefly in the ED to discuss his admission symptoms, weight loss, etc.  Pt reports no Family Hx of diabetes and no prior diagnosis of diabetes for himself.  Pt reports significant weight loss, extreme thirst, excessive urination--Does not get regular medical care.  No recent sick contacts and has not taken any recent Steroids for any health issues.  Reviewed pt's admission lab Glucose (397) and admission fingerstick CBG (430).  Discussed with pt that we have treated him with IVF and pt now getting Antibiotics.  Also explained to pt that the MD has ordered One-time dose Novolog insulin--explained what Novolog is and how it will lower the glucose levels.  Explained  to pt that we have drawn a Hemoglobin A1c level--explained what it is and how it measures 3 month glucose average--Pt educated that an A1c of 6.5% or higher is indicative of positive diagnosis diabetes.  I reviewed the acute and chronic complications of sustained hyperglycemia with pt.  I explained to pt that I cannot diagnose him with diabetes since I am not a physician, however, he has many symptoms of diabetes.  I gave pt a very basic educational guide on diabetes and placed at the bedside.  TOC consult placed for assistance with affordable health care after d/c (not sure if pt will be admitted yet).      --Will follow patient during hospitalization--  Ambrose Finland RN, MSN, CDCES Diabetes Coordinator Inpatient Glycemic Control Team Team Pager: [phone] (8a-5p)

## 2022-07-09 NOTE — Consult Note (Addendum)
Urology Consult   Physician requesting consult: Calvert Cantor, MD  Reason for consult: prostate abscess  History of Present Illness: Alejandro Santiago is a 47 y.o. male with no PMH who presented with dysuria and perineal/rectal pain for 3 days. Patient reports this came on relatively suddenly a few days ago and has been progressively getting worse. It burns every time he pees and the pain lingers after he urinates. He reports he is peeing overall fine but endorses frequency as well. He denies fevers but endorses occasional chills. No hematuria. No nausea, vomiting. No history of UTIs or epididymitis.   Patient is afebrile and HDS apart from hypertension. CT A/P with contrast demonstrates ~5.7 x 3.3cm multiloculated prostate abscess. UA grossly positive for infection (pos nitrite, moderate LE, >50 WBC). WBC 16.8.   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 He says his last use was ~2-3 days ago  He denies a history of voiding or storage urinary symptoms, hematuria, UTIs, STDs, urolithiasis, GU malignancy/trauma/surgery.  Past Medical History:  Diagnosis Date   Hypertension     History reviewed. No pertinent surgical history.  Current Hospital Medications:  Home Meds:  No current facility-administered medications on file prior to encounter.   Current Outpatient Medications on File Prior to Encounter  Medication Sig Dispense Refill   benzonatate (TESSALON PERLES) 100 MG capsule Take 1 capsule (100 mg total) by mouth 3 (three) times daily as needed. (Patient not taking: Reported on 12/03/2019) 20 capsule 0   clindamycin (CLEOCIN) 300 MG capsule Take 1 capsule (300 mg total) by mouth 4 (four) times daily. X 7 days 28 capsule 0   HYDROcodone-acetaminophen (NORCO) 5-325 MG tablet Take 1 tablet by mouth every 4 (four) hours as needed for moderate pain. 10 tablet 0   ibuprofen (ADVIL,MOTRIN) 200 MG tablet Take 3 tablets (600 mg total) by mouth every 8 (eight)  hours as needed (pain). (Patient not taking: Reported on 12/03/2019) 30 tablet 0   lidocaine (LIDODERM) 5 % Place 1 patch onto the skin daily. Remove & Discard patch within 12 hours or as directed by MD (Patient not taking: Reported on 12/03/2019) 30 patch 0   methocarbamol (ROBAXIN) 500 MG tablet Take 1 tablet (500 mg total) by mouth 2 (two) times daily. (Patient not taking: Reported on 12/03/2019) 20 tablet 0   naproxen (NAPROSYN) 500 MG tablet Take 1 tablet (500 mg total) by mouth 2 (two) times daily. (Patient not taking: Reported on 12/03/2019) 30 tablet 0     Scheduled Meds:  enoxaparin (LOVENOX) injection  40 mg Subcutaneous Q24H   insulin aspart  0-15 Units Subcutaneous TID WC   nicotine  14 mg Transdermal Q24H   Continuous Infusions:  sodium chloride     [START ON 07/10/2022] cefTRIAXone (ROCEPHIN)  IV     [START ON 07/10/2022] metronidazole     PRN Meds:.acetaminophen **OR** acetaminophen, HYDROcodone-acetaminophen, morphine injection, ondansetron **OR** ondansetron (ZOFRAN) IV  Allergies: No Known Allergies  History reviewed. No pertinent family history.  Social History:  reports that he has been smoking cigarettes. He has been smoking an average of .5 packs per day. He has never used smokeless tobacco. He reports current alcohol use. He reports that he does not use drugs.  ROS: A complete review of systems was performed.  All systems are negative except for pertinent findings as noted.  Physical Exam:  Vital signs in last 24 hours: Temp:  [97.7 F (36.5 C)-98 F (36.7 C)] 97.7 F (  36.5 C) (05/10 1820) Pulse Rate:  [86-100] 91 (05/10 1630) Resp:  [13-23] 16 (05/10 1630) BP: (134-164)/(82-106) 164/97 (05/10 1630) SpO2:  [95 %-98 %] 95 % (05/10 1630) Weight:  [72.6 kg] 72.6 kg (05/10 1107) Constitutional:  Alert and oriented, No acute distress Cardiovascular: Regular rate and rhythm, No JVD Respiratory: Normal respiratory effort, Lungs clear bilaterally GI: Abdomen is  soft, nontender, nondistended, no abdominal masses GU: Tender to palpation of perineum, no palpable fluid collections. Orthotopic meatus, erythematous papules diffusely on penile shaft and glans Lymphatic: No lymphadenopathy Neurologic: Grossly intact, no focal deficits Psychiatric: Normal mood and affect  Laboratory Data:  Recent Labs    07/09/22 1122  WBC 16.8*  HGB 15.9  HCT 44.3  PLT 396    Recent Labs    07/09/22 1248  NA 133*  K 3.5  CL 91*  GLUCOSE 397*  BUN 7  CALCIUM 8.5*  CREATININE 0.70     Results for orders placed or performed during the hospital encounter of 07/09/22 (from the past 24 hour(s))  CBG monitoring, ED     Status: Abnormal   Collection Time: 07/09/22 11:09 AM  Result Value Ref Range   Glucose-Capillary 430 (H) 70 - 99 mg/dL  Beta-hydroxybutyric acid     Status: None   Collection Time: 07/09/22 11:22 AM  Result Value Ref Range   Beta-Hydroxybutyric Acid 0.18 0.05 - 0.27 mmol/L  CBC with Differential (PNL)     Status: Abnormal   Collection Time: 07/09/22 11:22 AM  Result Value Ref Range   WBC 16.8 (H) 4.0 - 10.5 K/uL   RBC 5.04 4.22 - 5.81 MIL/uL   Hemoglobin 15.9 13.0 - 17.0 g/dL   HCT 16.1 09.6 - 04.5 %   MCV 87.9 80.0 - 100.0 fL   MCH 31.5 26.0 - 34.0 pg   MCHC 35.9 30.0 - 36.0 g/dL   RDW 40.9 81.1 - 91.4 %   Platelets 396 150 - 400 K/uL   nRBC 0.0 0.0 - 0.2 %   Neutrophils Relative % 85 %   Neutro Abs 14.4 (H) 1.7 - 7.7 K/uL   Lymphocytes Relative 7 %   Lymphs Abs 1.1 0.7 - 4.0 K/uL   Monocytes Relative 6 %   Monocytes Absolute 1.0 0.1 - 1.0 K/uL   Eosinophils Relative 1 %   Eosinophils Absolute 0.2 0.0 - 0.5 K/uL   Basophils Relative 0 %   Basophils Absolute 0.1 0.0 - 0.1 K/uL   Immature Granulocytes 1 %   Abs Immature Granulocytes 0.11 (H) 0.00 - 0.07 K/uL  Blood gas, venous     Status: Abnormal   Collection Time: 07/09/22 11:22 AM  Result Value Ref Range   pH, Ven 7.53 (H) 7.25 - 7.43   pCO2, Ven 40 (L) 44 - 60 mmHg    pO2, Ven 62 (H) 32 - 45 mmHg   Bicarbonate 33.4 (H) 20.0 - 28.0 mmol/L   Acid-Base Excess 9.8 (H) 0.0 - 2.0 mmol/L   O2 Saturation 94.2 %   Patient temperature 37.0   Hemoglobin A1c     Status: Abnormal   Collection Time: 07/09/22 11:22 AM  Result Value Ref Range   Hgb A1c MFr Bld 9.9 (H) 4.8 - 5.6 %   Mean Plasma Glucose 237.43 mg/dL  Ethanol     Status: None   Collection Time: 07/09/22 11:23 AM  Result Value Ref Range   Alcohol, Ethyl (B) <10 <10 mg/dL  Urinalysis, Routine w reflex microscopic -Urine, Clean Catch  Status: Abnormal   Collection Time: 07/09/22 12:38 PM  Result Value Ref Range   Color, Urine STRAW (A) YELLOW   APPearance HAZY (A) CLEAR   Specific Gravity, Urine 1.029 1.005 - 1.030   pH 7.0 5.0 - 8.0   Glucose, UA >=500 (A) NEGATIVE mg/dL   Hgb urine dipstick SMALL (A) NEGATIVE   Bilirubin Urine NEGATIVE NEGATIVE   Ketones, ur NEGATIVE NEGATIVE mg/dL   Protein, ur NEGATIVE NEGATIVE mg/dL   Nitrite POSITIVE (A) NEGATIVE   Leukocytes,Ua MODERATE (A) NEGATIVE   RBC / HPF 6-10 0 - 5 RBC/hpf   WBC, UA >50 0 - 5 WBC/hpf   Bacteria, UA NONE SEEN NONE SEEN   Squamous Epithelial / HPF 0-5 0 - 5 /HPF   WBC Clumps PRESENT   Rapid urine drug screen (hospital performed)     Status: Abnormal   Collection Time: 07/09/22 12:38 PM  Result Value Ref Range   Opiates NONE DETECTED NONE DETECTED   Cocaine POSITIVE (A) NONE DETECTED   Benzodiazepines NONE DETECTED NONE DETECTED   Amphetamines NONE DETECTED NONE DETECTED   Tetrahydrocannabinol NONE DETECTED NONE DETECTED   Barbiturates NONE DETECTED NONE DETECTED  CBG monitoring, ED     Status: Abnormal   Collection Time: 07/09/22 12:40 PM  Result Value Ref Range   Glucose-Capillary 418 (H) 70 - 99 mg/dL  Comprehensive metabolic panel     Status: Abnormal   Collection Time: 07/09/22 12:48 PM  Result Value Ref Range   Sodium 133 (L) 135 - 145 mmol/L   Potassium 3.5 3.5 - 5.1 mmol/L   Chloride 91 (L) 98 - 111 mmol/L   CO2  31 22 - 32 mmol/L   Glucose, Bld 397 (H) 70 - 99 mg/dL   BUN 7 6 - 20 mg/dL   Creatinine, Ser 1.61 0.61 - 1.24 mg/dL   Calcium 8.5 (L) 8.9 - 10.3 mg/dL   Total Protein 7.4 6.5 - 8.1 g/dL   Albumin 2.7 (L) 3.5 - 5.0 g/dL   AST 17 15 - 41 U/L   ALT 12 0 - 44 U/L   Alkaline Phosphatase 77 38 - 126 U/L   Total Bilirubin 1.1 0.3 - 1.2 mg/dL   GFR, Estimated >09 >60 mL/min   Anion gap 11 5 - 15  Lipase, blood     Status: None   Collection Time: 07/09/22 12:48 PM  Result Value Ref Range   Lipase 32 11 - 51 U/L  CBG monitoring, ED     Status: Abnormal   Collection Time: 07/09/22  4:29 PM  Result Value Ref Range   Glucose-Capillary 255 (H) 70 - 99 mg/dL   No results found for this or any previous visit (from the past 240 hour(s)).  Renal Function: Recent Labs    07/09/22 1248  CREATININE 0.70   Estimated Creatinine Clearance: 117.2 mL/min (by C-G formula based on SCr of 0.7 mg/dL).  Radiologic Imaging: CT ABDOMEN PELVIS W CONTRAST  Result Date: 07/09/2022 CLINICAL DATA:  Anterior inferior abdominal pain for evaluation of pyelonephritis EXAM: CT ABDOMEN AND PELVIS WITH CONTRAST TECHNIQUE: Multidetector CT imaging of the abdomen and pelvis was performed using the standard protocol following bolus administration of intravenous contrast. RADIATION DOSE REDUCTION: This exam was performed according to the departmental dose-optimization program which includes automated exposure control, adjustment of the mA and/or kV according to patient size and/or use of iterative reconstruction technique. CONTRAST:  OMNIPAQUE IOHEXOL 300 MG/ML  SOLN COMPARISON:  None Available. FINDINGS: Lower chest:  Left lower lobe ground-glass nodule measures 9 x 7 mm (5:29). Additional ill-defined ground-glass density on series 5, image 18. Additional scattered solid nodules measuring up to 3 mm in the right (5:17) and left lower lobes (5:8,9). Triangular right middle lobe perifissural nodule (5:8) is most often an  intrapulmonary lymph node. No specific follow-up imaging recommended. No pleural effusion or pneumothorax demonstrated. Partially imaged heart size is normal. Hepatobiliary: Hypoattenuation along the falciform ligament may reflect focal steatosis or perfusional variation. No intra or extrahepatic biliary ductal dilation. Normal gallbladder. Pancreas: No focal lesions or main ductal dilation. Spleen: Enlarged in the AP dimension measures 14.9 cm. No focal lesions. Adrenals/Urinary Tract: Nodular thickening of the right adrenal gland without discrete nodule. Left adrenal nodule. Thick-walled hypoenhancing exophytic lesion along the anterolateral left interpolar kidney measures 1.4 x 1.0 cm (2:38). No hydronephrosis or stones. Circumferential mural thickening and pericystic stranding. Stomach/Bowel: Normal appearance of the stomach. No evidence of bowel wall thickening, distention, or inflammatory changes. Normal appendix. Vascular/Lymphatic: No significant vascular findings are present. No enlarged abdominal or pelvic lymph nodes. Reproductive: Multiloculated rim enhancing fluid collections within the prostate gland measuring up to 5.7 x 3.3 cm in conglomerate (2:90). Other: No free fluid, fluid collection, or free air. Musculoskeletal: No acute or abnormal lytic or blastic osseous lesions. IMPRESSION: 1. Multiloculated rim enhancing fluid collections within the prostate gland measuring up to 5.7 x 3.3 cm in conglomerate, suspicious for abscesses. Cystic degeneration of BPH nodule and cystic neoplasm are considered much less likely in the setting of acute urinary tract infection. 2. Circumferential mural thickening and pericystic stranding, suggestive of cystitis. 3. Thick-walled hypoenhancing exophytic lesion along the anterolateral left interpolar kidney measures 1.4 x 1.0 cm, suspicious for hypoenhancing primary renal neoplasm. Recommend further evaluation with nonemergent renal mass protocol MRI or CT. 4. Left  lower lobe ground-glass nodule measures 9 x 7 mm, likely infectious/inflammatory. Additional scattered solid nodules measuring up to 3 mm in the bilateral lower lobes. Non-contrast chest CT at 3-6 months is recommended. If nodules persist, subsequent management will be based upon the most suspicious nodule(s). This recommendation follows the consensus statement: Guidelines for Management of Incidental Pulmonary Nodules Detected on CT Images: From the Fleischner Society 2017; Radiology 2017; 284:228-243. 5. Splenomegaly. Electronically Signed   By: Agustin Cree M.D.   On: 07/09/2022 16:34   DG Chest 2 View  Result Date: 07/09/2022 CLINICAL DATA:  Five day history of shortness of breath EXAM: CHEST - 2 VIEW COMPARISON:  Chest radiograph dated 06/28/2015 FINDINGS: Low lung volumes. No focal consolidations. No pleural effusion or pneumothorax. The heart size and mediastinal contours are within normal limits. No acute osseous abnormality. IMPRESSION: No active cardiopulmonary disease. Electronically Signed   By: Agustin Cree M.D.   On: 07/09/2022 12:10    I independently reviewed the above imaging studies.  Impression/Recommendation #Prostate abscess - OR for TURP, prostate abscess unroofing tomorrow, 07/10/22 with Dr. Mena Goes. Discussed + cocaine on Utox with anesthesia, no contraindication to GA unless acutely intoxicated - Please make patient NPO at MN - Please obtain PVR; if elevated, please place foley catheter - Agree with IV CTX for now; if not responding well on that regimen, consider transition to fluoroquinolone or broad-spectrum beta lactam with or without gent - Call Urology if patient becomes febrile or otherwise hemodynamically unstable prior to OR to discuss need for expedited management  #Left renal lesion - suspicious for malignancy; will discuss further with patient while in-house and set-up for outpatient surveillance  and discussions regarding management  #Genital lesions - Possibly  HSV - GC/CT, HIV ordered and pending  #Hyperglycemia - Per primary team  Carlus Pavlov 07/09/2022, 6:38 PM

## 2022-07-09 NOTE — ED Notes (Signed)
ED TO INPATIENT HANDOFF REPORT  Name/Age/Gender Alejandro Santiago 47 y.o. male  Code Status    Code Status Orders  (From admission, onward)           Start     Ordered   07/09/22 1751  Full code  Continuous       Question:  By:  Answer:  Consent: discussion documented in EHR   07/09/22 1752           Code Status History     This patient has a current code status but no historical code status.       Home/SNF/Other Home  Chief Complaint Prostate abscess [N41.2]  Level of Care/Admitting Diagnosis ED Disposition     ED Disposition  Admit   Condition  --   Comment  Hospital Area: [facility] [[id number]]  Level of Care: Med-Surg [16]  May admit patient to [facility] or [facility] if equivalent level of care is available:: Yes  Covid Evaluation: Asymptomatic - no recent exposure (last 10 days) testing not required  Diagnosis: Prostate abscess [161096]  Admitting Physician: Calvert Cantor [[id number]]  Attending Physician: Calvert Cantor [[id number]]  Certification:: I certify this patient will need inpatient services for at least 2 midnights  Estimated Length of Stay: 3          Medical History Past Medical History:  Diagnosis Date   Hypertension     Allergies No Known Allergies  IV Location/Drains/Wounds Patient Lines/Drains/Airways Status     Active Line/Drains/Airways     Name Placement date Placement time Site Days   Peripheral IV 07/09/22 18 G Left Antecubital 07/09/22  1102  Antecubital  less than 1            Labs/Imaging Results for orders placed or performed during the hospital encounter of 07/09/22 (from the past 48 hour(s))  CBG monitoring, ED     Status: Abnormal   Collection Time: 07/09/22 11:09 AM  Result Value Ref Range   Glucose-Capillary 430 (H) 70 - 99 mg/dL    Comment: Glucose reference range applies only to samples taken after fasting for at least 8 hours.  Beta-hydroxybutyric acid     Status: None    Collection Time: 07/09/22 11:22 AM  Result Value Ref Range   Beta-Hydroxybutyric Acid 0.18 0.05 - 0.27 mmol/L    Comment: Performed at [facility], [address], [place], [place] [address]  CBC with Differential (PNL)     Status: Abnormal   Collection Time: 07/09/22 11:22 AM  Result Value Ref Range   WBC 16.8 (H) 4.0 - 10.5 K/uL   RBC 5.04 4.22 - 5.81 MIL/uL   Hemoglobin 15.9 13.0 - 17.0 g/dL   HCT 98.1 19.1 - 47.8 %   MCV 87.9 80.0 - 100.0 fL   MCH 31.5 26.0 - 34.0 pg   MCHC 35.9 30.0 - 36.0 g/dL   RDW 29.5 62.1 - 30.8 %   Platelets 396 150 - 400 K/uL   nRBC 0.0 0.0 - 0.2 %   Neutrophils Relative % 85 %   Neutro Abs 14.4 (H) 1.7 - 7.7 K/uL   Lymphocytes Relative 7 %   Lymphs Abs 1.1 0.7 - 4.0 K/uL   Monocytes Relative 6 %   Monocytes Absolute 1.0 0.1 - 1.0 K/uL   Eosinophils Relative 1 %   Eosinophils Absolute 0.2 0.0 - 0.5 K/uL   Basophils Relative 0 %   Basophils Absolute 0.1 0.0 - 0.1 K/uL  Immature Granulocytes 1 %   Abs Immature Granulocytes 0.11 (H) 0.00 - 0.07 K/uL    Comment: Performed at [facility], [address], [place], [place] [address]  Blood gas, venous     Status: Abnormal   Collection Time: 07/09/22 11:22 AM  Result Value Ref Range   pH, Ven 7.53 (H) 7.25 - 7.43   pCO2, Ven 40 (L) 44 - 60 mmHg   pO2, Ven 62 (H) 32 - 45 mmHg   Bicarbonate 33.4 (H) 20.0 - 28.0 mmol/L   Acid-Base Excess 9.8 (H) 0.0 - 2.0 mmol/L   O2 Saturation 94.2 %   Patient temperature 37.0     Comment: Performed at [facility], [address], [place], [place] [address]  Hemoglobin A1c     Status: Abnormal   Collection Time: 07/09/22 11:22 AM  Result Value Ref Range   Hgb A1c MFr Bld 9.9 (H) 4.8 - 5.6 %    Comment: (NOTE) Pre diabetes:          5.7%-6.4%  Diabetes:              >6.4%  Glycemic control for   <7.0% adults with diabetes    Mean Plasma Glucose 237.43 mg/dL    Comment: Performed at [facility], [address], [place], [place] [address]  Ethanol     Status: None   Collection Time: 07/09/22 11:23 AM  Result Value Ref Range   Alcohol, Ethyl (B) <10 <10 mg/dL    Comment: (NOTE) Lowest detectable limit for serum alcohol is 10 mg/dL.  For medical purposes only. Performed at [facility], [address] [place], [place] [address]   Urinalysis, Routine w reflex microscopic -Urine, Clean Catch     Status: Abnormal   Collection Time: 07/09/22 12:38 PM  Result Value Ref Range   Color, Urine STRAW (A) YELLOW   APPearance HAZY (A) CLEAR   Specific Gravity, Urine 1.029 1.005 - 1.030   pH 7.0 5.0 - 8.0   Glucose, UA >=500 (A) NEGATIVE mg/dL   Hgb urine dipstick SMALL (A) NEGATIVE   Bilirubin Urine NEGATIVE NEGATIVE   Ketones, ur NEGATIVE NEGATIVE mg/dL   Protein, ur NEGATIVE NEGATIVE mg/dL   Nitrite POSITIVE (A) NEGATIVE   Leukocytes,Ua MODERATE (A) NEGATIVE   RBC / HPF 6-10 0 - 5 RBC/hpf   WBC, UA >50 0 - 5 WBC/hpf   Bacteria, UA NONE SEEN NONE SEEN   Squamous Epithelial / HPF 0-5 0 - 5 /HPF   WBC Clumps PRESENT     Comment: Performed at [facility], [address] [place], [place] [address]  Rapid urine drug screen (hospital performed)     Status: Abnormal   Collection Time: 07/09/22 12:38 PM  Result Value Ref Range   Opiates NONE DETECTED NONE DETECTED   Cocaine POSITIVE (A) NONE DETECTED   Benzodiazepines NONE DETECTED NONE DETECTED   Amphetamines NONE DETECTED NONE DETECTED   Tetrahydrocannabinol NONE DETECTED NONE DETECTED   Barbiturates NONE DETECTED NONE DETECTED    Comment: (NOTE) DRUG SCREEN FOR MEDICAL PURPOSES ONLY.  IF CONFIRMATION IS NEEDED FOR ANY PURPOSE, NOTIFY LAB WITHIN 5 DAYS.  LOWEST DETECTABLE LIMITS FOR URINE DRUG SCREEN Drug Class                     Cutoff (ng/mL) Amphetamine and metabolites    1000 Barbiturate and metabolites    200 Benzodiazepine  200 Opiates and metabolites         300 Cocaine and metabolites        300 THC                            50 Performed at [facility], [address], [place], [place] [address]   CBG monitoring, ED     Status: Abnormal   Collection Time: 07/09/22 12:40 PM  Result Value Ref Range   Glucose-Capillary 418 (H) 70 - 99 mg/dL    Comment: Glucose reference range applies only to samples taken after fasting for at least 8 hours.  Comprehensive metabolic panel     Status: Abnormal   Collection Time: 07/09/22 12:48 PM  Result Value Ref Range   Sodium 133 (L) 135 - 145 mmol/L   Potassium 3.5 3.5 - 5.1 mmol/L   Chloride 91 (L) 98 - 111 mmol/L   CO2 31 22 - 32 mmol/L   Glucose, Bld 397 (H) 70 - 99 mg/dL    Comment: Glucose reference range applies only to samples taken after fasting for at least 8 hours.   BUN 7 6 - 20 mg/dL   Creatinine, Ser 2.13 0.61 - 1.24 mg/dL   Calcium 8.5 (L) 8.9 - 10.3 mg/dL   Total Protein 7.4 6.5 - 8.1 g/dL   Albumin 2.7 (L) 3.5 - 5.0 g/dL   AST 17 15 - 41 U/L   ALT 12 0 - 44 U/L   Alkaline Phosphatase 77 38 - 126 U/L   Total Bilirubin 1.1 0.3 - 1.2 mg/dL   GFR, Estimated >08 >65 mL/min    Comment: (NOTE) Calculated using the CKD-EPI Creatinine Equation (2021)    Anion gap 11 5 - 15    Comment: Performed at [facility], [address] [place], [place] [address]  Lipase, blood     Status: None   Collection Time: 07/09/22 12:48 PM  Result Value Ref Range   Lipase 32 11 - 51 U/L    Comment: Performed at [facility], [address] [place], [place] [address]  CBG monitoring, ED     Status: Abnormal   Collection Time: 07/09/22  4:29 PM  Result Value Ref Range   Glucose-Capillary 255 (H) 70 - 99 mg/dL    Comment: Glucose reference range applies only to samples taken after fasting for at least 8 hours.   CT ABDOMEN PELVIS W CONTRAST  Result Date: 07/09/2022 CLINICAL DATA:  Anterior inferior abdominal pain for evaluation of pyelonephritis  EXAM: CT ABDOMEN AND PELVIS WITH CONTRAST TECHNIQUE: Multidetector CT imaging of the abdomen and pelvis was performed using the standard protocol following bolus administration of intravenous contrast. RADIATION DOSE REDUCTION: This exam was performed according to the departmental dose-optimization program which includes automated exposure control, adjustment of the mA and/or kV according to patient size and/or use of iterative reconstruction technique. CONTRAST:  OMNIPAQUE IOHEXOL 300 MG/ML  SOLN COMPARISON:  None Available. FINDINGS: Lower chest: Left lower lobe ground-glass nodule measures 9 x 7 mm (5:29). Additional ill-defined ground-glass density on series 5, image 18. Additional scattered solid nodules measuring up to 3 mm in the right (5:17) and left lower lobes (5:8,9). Triangular right middle lobe perifissural nodule (5:8) is most often an intrapulmonary lymph node. No specific follow-up imaging recommended. No pleural effusion or pneumothorax demonstrated. Partially imaged heart size is normal. Hepatobiliary: Hypoattenuation along the falciform ligament may reflect  focal steatosis or perfusional variation. No intra or extrahepatic biliary ductal dilation. Normal gallbladder. Pancreas: No focal lesions or main ductal dilation. Spleen: Enlarged in the AP dimension measures 14.9 cm. No focal lesions. Adrenals/Urinary Tract: Nodular thickening of the right adrenal gland without discrete nodule. Left adrenal nodule. Thick-walled hypoenhancing exophytic lesion along the anterolateral left interpolar kidney measures 1.4 x 1.0 cm (2:38). No hydronephrosis or stones. Circumferential mural thickening and pericystic stranding. Stomach/Bowel: Normal appearance of the stomach. No evidence of bowel wall thickening, distention, or inflammatory changes. Normal appendix. Vascular/Lymphatic: No significant vascular findings are present. No enlarged abdominal or pelvic lymph nodes. Reproductive: Multiloculated rim  enhancing fluid collections within the prostate gland measuring up to 5.7 x 3.3 cm in conglomerate (2:90). Other: No free fluid, fluid collection, or free air. Musculoskeletal: No acute or abnormal lytic or blastic osseous lesions. IMPRESSION: 1. Multiloculated rim enhancing fluid collections within the prostate gland measuring up to 5.7 x 3.3 cm in conglomerate, suspicious for abscesses. Cystic degeneration of BPH nodule and cystic neoplasm are considered much less likely in the setting of acute urinary tract infection. 2. Circumferential mural thickening and pericystic stranding, suggestive of cystitis. 3. Thick-walled hypoenhancing exophytic lesion along the anterolateral left interpolar kidney measures 1.4 x 1.0 cm, suspicious for hypoenhancing primary renal neoplasm. Recommend further evaluation with nonemergent renal mass protocol MRI or CT. 4. Left lower lobe ground-glass nodule measures 9 x 7 mm, likely infectious/inflammatory. Additional scattered solid nodules measuring up to 3 mm in the bilateral lower lobes. Non-contrast chest CT at 3-6 months is recommended. If nodules persist, subsequent management will be based upon the most suspicious nodule(s). This recommendation follows the consensus statement: Guidelines for Management of Incidental Pulmonary Nodules Detected on CT Images: From the Fleischner Society 2017; Radiology 2017; 284:228-243. 5. Splenomegaly. Electronically Signed   By: Agustin Cree M.D.   On: 07/09/2022 16:34   DG Chest 2 View  Result Date: 07/09/2022 CLINICAL DATA:  Five day history of shortness of breath EXAM: CHEST - 2 VIEW COMPARISON:  Chest radiograph dated 06/28/2015 FINDINGS: Low lung volumes. No focal consolidations. No pleural effusion or pneumothorax. The heart size and mediastinal contours are within normal limits. No acute osseous abnormality. IMPRESSION: No active cardiopulmonary disease. Electronically Signed   By: Agustin Cree M.D.   On: 07/09/2022 12:10    Pending  Labs Unresulted Labs (From admission, onward)     Start     Ordered   07/16/22 0500  Creatinine, serum  (enoxaparin (LOVENOX)    CrCl >/= 30 ml/min)  Weekly,   R     Comments: while on enoxaparin therapy    07/09/22 1752   07/09/22 1751  HIV Antibody (routine testing w rflx)  (HIV Antibody (Routine testing w reflex) panel)  Once,   R        07/09/22 1752   07/09/22 1751  CBC  (enoxaparin (LOVENOX)    CrCl >/= 30 ml/min)  Once,   R       Comments: Baseline for enoxaparin therapy IF NOT ALREADY DRAWN.  Notify MD if PLT < 100 K.    07/09/22 1752   07/09/22 1751  Creatinine, serum  (enoxaparin (LOVENOX)    CrCl >/= 30 ml/min)  Once,   R       Comments: Baseline for enoxaparin therapy IF NOT ALREADY DRAWN.    07/09/22 1752   07/09/22 1641  Blood culture (routine x 2)  BLOOD CULTURE X 2,   R (with STAT  occurrences)      07/09/22 1640   07/09/22 1305  Urine Culture  Add-on,   AD       Question:  Indication  Answer:  Dysuria   07/09/22 1304   07/09/22 1122  Beta-hydroxybutyric acid  (Diabetes Ketoacidosis (DKA))  Now then every 8 hours,   STAT (with URGENT occurrences)      07/09/22 1122            Vitals/Pain Today's Vitals   07/09/22 1430 07/09/22 1527 07/09/22 1630 07/09/22 1820  BP: (!) 161/100  (!) 164/97   Pulse: 90  91   Resp: 13  16   Temp:  98 F (36.7 C)  97.7 F (36.5 C)  TempSrc:  Oral  Oral  SpO2: 95%  95%   Weight:      Height:        Isolation Precautions No active isolations  Medications Medications  enoxaparin (LOVENOX) injection 40 mg (has no administration in time range)  acetaminophen (TYLENOL) tablet 650 mg (has no administration in time range)    Or  acetaminophen (TYLENOL) suppository 650 mg (has no administration in time range)  HYDROcodone-acetaminophen (NORCO/VICODIN) 5-325 MG per tablet 1-2 tablet (has no administration in time range)  ondansetron (ZOFRAN) tablet 4 mg (has no administration in time range)    Or  ondansetron (ZOFRAN)  injection 4 mg (has no administration in time range)  insulin aspart (novoLOG) injection 0-15 Units (8 Units Subcutaneous Given 07/09/22 1813)  0.9 %  sodium chloride infusion (has no administration in time range)  morphine (PF) 2 MG/ML injection 2 mg (2 mg Intravenous Given 07/09/22 1809)  cefTRIAXone (ROCEPHIN) 2 g in sodium chloride 0.9 % 100 mL IVPB (has no administration in time range)  metroNIDAZOLE (FLAGYL) IVPB 500 mg (has no administration in time range)  nicotine (NICODERM CQ - dosed in mg/24 hours) patch 14 mg (has no administration in time range)  lactated ringers bolus 1,000 mL (0 mLs Intravenous Stopped 07/09/22 1340)  cefTRIAXone (ROCEPHIN) 2 g in sodium chloride 0.9 % 100 mL IVPB (0 g Intravenous Stopped 07/09/22 1406)  insulin aspart (novoLOG) injection 5 Units (5 Units Subcutaneous Given 07/09/22 1500)  HYDROmorphone (DILAUDID) injection 1 mg (1 mg Intravenous Given 07/09/22 1500)  iohexol (OMNIPAQUE) 300 MG/ML solution 100 mL (100 mLs Intravenous Contrast Given 07/09/22 1533)    Mobility walks

## 2022-07-09 NOTE — Progress Notes (Signed)
The patient was found thrashing around in the bed in extreme discomfort,  holding onto his private area. The charge nurse who is the writer was called to the room. States " Please take this catheter out right now". I explained to the patient the importance of him having the catheter for his urinary retention and  for his procedure in the morning. At this time he is still requesting that it be removed. The catheter  was removed and he felt immediate relief,  a patch of white cloudy discharge was noted in the catheter tubing, 2 mg of IV Morphine was given along with a urinal at the bedside. MD Eskridge made aware, ok the leave the fc out for now. Plan of care ongoing, no further  concerns as of present.

## 2022-07-09 NOTE — ED Provider Notes (Signed)
[place] EMERGENCY DEPARTMENT AT [facility] Provider Note   CSN: [id number] Arrival date & time: 07/09/22  1057     History  Chief Complaint  Patient presents with   Hyperglycemia   Abdominal Pain    Alejandro Santiago is a 47 y.o. male presenting to the ED with a constellation of symptoms.  Patient reports he has had weight loss for the past 2 to 3 months.  He reports for the past 5 days he has had fatigue, vomiting, excessive thirst, urination, constipation, cough, shortness of breath.  Patient does not see a doctor regularly.  He denies any known medical issues including diabetes.  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  EMS provided 200 cc of fluid en route to the hospital.  Patient said he feels "just lousy all over". Specifically he reports pressure in his rectum with painful defecation.  He denies receptive anal intercourse or insertion of any objects into his rectum  HPI     Home Medications Prior to Admission medications   Medication Sig Start Date End Date Taking? Authorizing Provider  benzonatate (TESSALON PERLES) 100 MG capsule Take 1 capsule (100 mg total) by mouth 3 (three) times daily as needed. Patient not taking: Reported on 12/03/2019 05/24/18   Bennie Pierini, FNP  clindamycin (CLEOCIN) 300 MG capsule Take 1 capsule (300 mg total) by mouth 4 (four) times daily. X 7 days 12/03/19   Mancel Bale, MD  HYDROcodone-acetaminophen [facility]) 5-325 MG tablet Take 1 tablet by mouth every 4 (four) hours as needed for moderate pain. 12/03/19   Mancel Bale, MD  ibuprofen (ADVIL,MOTRIN) 200 MG tablet Take 3 tablets (600 mg total) by mouth every 8 (eight) hours as needed (pain). Patient not taking: Reported on 12/03/2019 10/09/16   Caccavale, Sophia, PA-C  lidocaine (LIDODERM) 5 % Place 1 patch onto the skin daily. Remove & Discard patch within 12 hours or as directed by MD Patient not taking: Reported on 12/03/2019 02/20/19   Henderly, Britni A, PA-C   methocarbamol (ROBAXIN) 500 MG tablet Take 1 tablet (500 mg total) by mouth 2 (two) times daily. Patient not taking: Reported on 12/03/2019 02/20/19   Henderly, Britni A, PA-C  naproxen (NAPROSYN) 500 MG tablet Take 1 tablet (500 mg total) by mouth 2 (two) times daily. Patient not taking: Reported on 12/03/2019 06/28/15   Antony Madura, PA-C      Allergies    Patient has no known allergies.    Review of Systems   Review of Systems  Physical Exam Updated Vital Signs BP (!) 164/97   Pulse 91   Temp 98 F (36.7 C) (Oral)   Resp 16   Ht 6' (1.829 m)   Wt 72.6 kg   SpO2 95%   BMI 21.70 kg/m  Physical Exam Constitutional:      General: He is not in acute distress. HENT:     Head: Normocephalic and atraumatic.  Eyes:     Conjunctiva/sclera: Conjunctivae normal.     Pupils: Pupils are equal, round, and reactive to light.  Cardiovascular:     Rate and Rhythm: Normal rate and regular rhythm.  Pulmonary:     Effort: Pulmonary effort is normal. No respiratory distress.  Abdominal:     General: There is no distension.     Tenderness: There is no abdominal tenderness.  Skin:    General: Skin is warm and dry.     Comments: Folliculitis on the right side of the face, erythematous  rash on left forearm  Neurological:     General: No focal deficit present.     Mental Status: He is alert. Mental status is at baseline.  Psychiatric:        Mood and Affect: Mood normal.        Behavior: Behavior normal.     ED Results / Procedures / Treatments   Labs (all labs ordered are listed, but only abnormal results are displayed) Labs Reviewed  CBC WITH DIFFERENTIAL/PLATELET - Abnormal; Notable for the following components:      Result Value   WBC 16.8 (*)    Neutro Abs 14.4 (*)    Abs Immature Granulocytes 0.11 (*)    All other components within normal limits  URINALYSIS, ROUTINE W REFLEX MICROSCOPIC - Abnormal; Notable for the following components:   Color, Urine STRAW (*)     APPearance HAZY (*)    Glucose, UA >=500 (*)    Hgb urine dipstick SMALL (*)    Nitrite POSITIVE (*)    Leukocytes,Ua MODERATE (*)    All other components within normal limits  BLOOD GAS, VENOUS - Abnormal; Notable for the following components:   pH, Ven 7.53 (*)    pCO2, Ven 40 (*)    pO2, Ven 62 (*)    Bicarbonate 33.4 (*)    Acid-Base Excess 9.8 (*)    All other components within normal limits  RAPID URINE DRUG SCREEN, HOSP PERFORMED - Abnormal; Notable for the following components:   Cocaine POSITIVE (*)    All other components within normal limits  COMPREHENSIVE METABOLIC PANEL - Abnormal; Notable for the following components:   Sodium 133 (*)    Chloride 91 (*)    Glucose, Bld 397 (*)    Calcium 8.5 (*)    Albumin 2.7 (*)    All other components within normal limits  HEMOGLOBIN A1C - Abnormal; Notable for the following components:   Hgb A1c MFr Bld 9.9 (*)    All other components within normal limits  CBG MONITORING, ED - Abnormal; Notable for the following components:   Glucose-Capillary 430 (*)    All other components within normal limits  CBG MONITORING, ED - Abnormal; Notable for the following components:   Glucose-Capillary 418 (*)    All other components within normal limits  CBG MONITORING, ED - Abnormal; Notable for the following components:   Glucose-Capillary 255 (*)    All other components within normal limits  URINE CULTURE  CULTURE, BLOOD (ROUTINE X 2)  CULTURE, BLOOD (ROUTINE X 2)  BETA-HYDROXYBUTYRIC ACID  ETHANOL  LIPASE, BLOOD  BETA-HYDROXYBUTYRIC ACID  BETA-HYDROXYBUTYRIC ACID  HIV ANTIBODY (ROUTINE TESTING W REFLEX)  CBC  CREATININE, SERUM    EKG EKG Interpretation  Date/Time:  Friday Jul 09 2022 11:40:17 EDT Ventricular Rate:  91 PR Interval:  142 QRS Duration: 86 QT Interval:  378 QTC Calculation: 466 R Axis:   32 Text Interpretation: Sinus rhythm Left atrial enlargement RSR' in V1 or V2, right VCD or RVH Confirmed by Alvester Chou [phone]) on 07/09/2022 11:43:59 AM  Radiology CT ABDOMEN PELVIS W CONTRAST  Result Date: 07/09/2022 CLINICAL DATA:  Anterior inferior abdominal pain for evaluation of pyelonephritis EXAM: CT ABDOMEN AND PELVIS WITH CONTRAST TECHNIQUE: Multidetector CT imaging of the abdomen and pelvis was performed using the standard protocol following bolus administration of intravenous contrast. RADIATION DOSE REDUCTION: This exam was performed according to the departmental dose-optimization program which includes automated exposure control, adjustment of the mA and/or kV  according to patient size and/or use of iterative reconstruction technique. CONTRAST:  OMNIPAQUE IOHEXOL 300 MG/ML  SOLN COMPARISON:  None Available. FINDINGS: Lower chest: Left lower lobe ground-glass nodule measures 9 x 7 mm (5:29). Additional ill-defined ground-glass density on series 5, image 18. Additional scattered solid nodules measuring up to 3 mm in the right (5:17) and left lower lobes (5:8,9). Triangular right middle lobe perifissural nodule (5:8) is most often an intrapulmonary lymph node. No specific follow-up imaging recommended. No pleural effusion or pneumothorax demonstrated. Partially imaged heart size is normal. Hepatobiliary: Hypoattenuation along the falciform ligament may reflect focal steatosis or perfusional variation. No intra or extrahepatic biliary ductal dilation. Normal gallbladder. Pancreas: No focal lesions or main ductal dilation. Spleen: Enlarged in the AP dimension measures 14.9 cm. No focal lesions. Adrenals/Urinary Tract: Nodular thickening of the right adrenal gland without discrete nodule. Left adrenal nodule. Thick-walled hypoenhancing exophytic lesion along the anterolateral left interpolar kidney measures 1.4 x 1.0 cm (2:38). No hydronephrosis or stones. Circumferential mural thickening and pericystic stranding. Stomach/Bowel: Normal appearance of the stomach. No evidence of bowel wall thickening,  distention, or inflammatory changes. Normal appendix. Vascular/Lymphatic: No significant vascular findings are present. No enlarged abdominal or pelvic lymph nodes. Reproductive: Multiloculated rim enhancing fluid collections within the prostate gland measuring up to 5.7 x 3.3 cm in conglomerate (2:90). Other: No free fluid, fluid collection, or free air. Musculoskeletal: No acute or abnormal lytic or blastic osseous lesions. IMPRESSION: 1. Multiloculated rim enhancing fluid collections within the prostate gland measuring up to 5.7 x 3.3 cm in conglomerate, suspicious for abscesses. Cystic degeneration of BPH nodule and cystic neoplasm are considered much less likely in the setting of acute urinary tract infection. 2. Circumferential mural thickening and pericystic stranding, suggestive of cystitis. 3. Thick-walled hypoenhancing exophytic lesion along the anterolateral left interpolar kidney measures 1.4 x 1.0 cm, suspicious for hypoenhancing primary renal neoplasm. Recommend further evaluation with nonemergent renal mass protocol MRI or CT. 4. Left lower lobe ground-glass nodule measures 9 x 7 mm, likely infectious/inflammatory. Additional scattered solid nodules measuring up to 3 mm in the bilateral lower lobes. Non-contrast chest CT at 3-6 months is recommended. If nodules persist, subsequent management will be based upon the most suspicious nodule(s). This recommendation follows the consensus statement: Guidelines for Management of Incidental Pulmonary Nodules Detected on CT Images: From the Fleischner Society 2017; Radiology 2017; 284:228-243. 5. Splenomegaly. Electronically Signed   By: Agustin Cree M.D.   On: 07/09/2022 16:34   DG Chest 2 View  Result Date: 07/09/2022 CLINICAL DATA:  Five day history of shortness of breath EXAM: CHEST - 2 VIEW COMPARISON:  Chest radiograph dated 06/28/2015 FINDINGS: Low lung volumes. No focal consolidations. No pleural effusion or pneumothorax. The heart size and mediastinal  contours are within normal limits. No acute osseous abnormality. IMPRESSION: No active cardiopulmonary disease. Electronically Signed   By: Agustin Cree M.D.   On: 07/09/2022 12:10    Procedures .Critical Care  Performed by: Terald Sleeper, MD Authorized by: Terald Sleeper, MD   Critical care provider statement:    Critical care time (minutes):  45   Critical care time was exclusive of:  Separately billable procedures and treating other patients   Critical care was necessary to treat or prevent imminent or life-threatening deterioration of the following conditions:  Sepsis   Critical care was time spent personally by me on the following activities:  Ordering and performing treatments and interventions, ordering and review of laboratory studies,  ordering and review of radiographic studies, pulse oximetry, review of old charts, examination of patient and evaluation of patient's response to treatment     Medications Ordered in ED Medications  metroNIDAZOLE (FLAGYL) IVPB 500 mg (has no administration in time range)  enoxaparin (LOVENOX) injection 40 mg (has no administration in time range)  acetaminophen (TYLENOL) tablet 650 mg (has no administration in time range)    Or  acetaminophen (TYLENOL) suppository 650 mg (has no administration in time range)  HYDROcodone-acetaminophen (NORCO/VICODIN) 5-325 MG per tablet 1-2 tablet (has no administration in time range)  ondansetron (ZOFRAN) tablet 4 mg (has no administration in time range)    Or  ondansetron (ZOFRAN) injection 4 mg (has no administration in time range)  insulin aspart (novoLOG) injection 0-15 Units (has no administration in time range)  0.9 %  sodium chloride infusion (has no administration in time range)  lactated ringers bolus 1,000 mL (0 mLs Intravenous Stopped 07/09/22 1340)  cefTRIAXone (ROCEPHIN) 2 g in sodium chloride 0.9 % 100 mL IVPB (0 g Intravenous Stopped 07/09/22 1406)  insulin aspart (novoLOG) injection 5 Units (5  Units Subcutaneous Given 07/09/22 1500)  HYDROmorphone (DILAUDID) injection 1 mg (1 mg Intravenous Given 07/09/22 1500)  iohexol (OMNIPAQUE) 300 MG/ML solution 100 mL (100 mLs Intravenous Contrast Given 07/09/22 1533)    ED Course/ Medical Decision Making/ A&P Clinical Course as of 07/09/22 1754  Fri Jul 09, 2022  1415 Patient reassessed, remains clinically stable.  Complaining of diffuse pain "all over my body".  I have ordered some pain medication, and explained I would like to obtain CT imaging of the abdomen to evaluate for potential pyelonephritis, and also for other etiology of abdominal pain as he reports that his entire abdomen is hurting.  He is in agreement. [MT]  1649 CT findings are concerning for prostate infection which correlates with the patient's symptoms.  He may have a potential abscess within the prostate.  Patient will need to be admitted to the hospital and IV antibiotics, and added Flagyl for intra-abdominal flora coverage.  I updated the patient.  He confirms to me that he has not engaged in receptive anal intercourse or inserting any objects near his rectum. [MT]  1654 Dr Mena Goes Urology to see patient - will admit to hospitalist - urology asks to keep NPO for now [MT]    Clinical Course User Index [MT] Ramina Hulet, Kermit Balo, MD                             Medical Decision Making Amount and/or Complexity of Data Reviewed Labs: ordered. Radiology: ordered. ECG/medicine tests: ordered.  Risk Prescription drug management. Decision regarding hospitalization.   This patient presents to the ED with concern for fatigue, polyuria, polydipsia. This involves an extensive number of treatment options, and is a complaint that carries with it a high risk of complications and morbidity.  The differential diagnosis includes DKA versus new onset diabetes versus infection versus metabolic derangement, versus polysubstance use versus other  Additional history obtained from EMS  I  ordered and personally interpreted labs.  The pertinent results include: UA with positive nitrites and leukocytes.  UDS positive for cocaine.  No acidosis, no anion gap. WBC 16.8.  BS 397.    I ordered imaging studies including x-ray of the chest, CT of the abdomen I independently visualized and interpreted imaging which showed bladder wall thickening, rim-enhancing fluid collection within the prostate gland I agree with  the radiologist interpretation  The patient was maintained on a cardiac monitor.  I personally viewed and interpreted the cardiac monitored which showed an underlying rhythm of: Sinus rhythm and sinus tachycardia  Per my interpretation the patient's ECG shows normal sinus rhythm with no QT prolongation, no acute ischemic findings  I ordered medication including IV Rocephin for UTI, IV fluids and insulin for hyperglycemia.  IV Dilaudid for pain.  I have reviewed the patients home medicines and have made adjustments as needed  After the interventions noted above, I reevaluated the patient and found that they have: improved  Social Determinants of Health:TOC consulted and assisting with new PCP appointment; arranged  Dispostion:  After consideration of the diagnostic results and the patients response to treatment, I feel that the patent would benefit from medical admission.         Final Clinical Impression(s) / ED Diagnoses Final diagnoses:  Prostate infection    Rx / DC Orders ED Discharge Orders     None         Masin Shatto, Kermit Balo, MD 07/09/22 1754

## 2022-07-09 NOTE — ED Triage Notes (Addendum)
Pt BIB EMS from home, c/o increased SOB x 5 days. Per EMS per endorse fatigue, nausea, abdominal pain, polydipsia, urinary retention, and blister formation. Lost 23 lbs in the past 2 weeks. No known medical history, hx cocaine abuse. 200 fluids administered.   BP 150/90 P 100 RR 14 spO2 98%  CBG 4418 LAC

## 2022-07-09 NOTE — Care Management (Signed)
SA resources and PCP on the AVS

## 2022-07-09 NOTE — Progress Notes (Incomplete)
The patiemt was found thrashing around in the bed in extreme discomfort,  holding onto his private area. The charge nurse who is the writer was called to the room. States " please take this cather out right now". I expalin to the pat the imprtanceof him having the cather for his urinary rention and  for his procedure in the morning

## 2022-07-10 ENCOUNTER — Inpatient Hospital Stay (HOSPITAL_COMMUNITY): Payer: Medicaid Other | Admitting: Certified Registered"

## 2022-07-10 ENCOUNTER — Encounter (HOSPITAL_COMMUNITY)
Admission: EM | Disposition: A | Discharge status: Other Acute Inpatient Hospital | Payer: Self-pay | Source: Home / Self Care | Attending: Internal Medicine

## 2022-07-10 DIAGNOSIS — N412 Abscess of prostate: Secondary | ICD-10-CM

## 2022-07-10 DIAGNOSIS — F17210 Nicotine dependence, cigarettes, uncomplicated: Secondary | ICD-10-CM

## 2022-07-10 DIAGNOSIS — I10 Essential (primary) hypertension: Secondary | ICD-10-CM

## 2022-07-10 DIAGNOSIS — E1165 Type 2 diabetes mellitus with hyperglycemia: Secondary | ICD-10-CM

## 2022-07-10 HISTORY — PX: TRANSURETHRAL RESECTION OF PROSTATE: SHX73

## 2022-07-10 LAB — CULTURE, BLOOD (ROUTINE X 2)

## 2022-07-10 LAB — GLUCOSE, CAPILLARY
Glucose-Capillary: 433 mg/dL — ABNORMAL HIGH (ref 70–99)
Glucose-Capillary: 323 mg/dL — ABNORMAL HIGH (ref 70–99)
Glucose-Capillary: 183 mg/dL — ABNORMAL HIGH (ref 70–99)
Glucose-Capillary: 211 mg/dL — ABNORMAL HIGH (ref 70–99)
Glucose-Capillary: 230 mg/dL — ABNORMAL HIGH (ref 70–99)
Glucose-Capillary: 362 mg/dL — ABNORMAL HIGH (ref 70–99)

## 2022-07-10 LAB — CBC
HCT: 35 % — ABNORMAL LOW (ref 39.0–52.0)
Hemoglobin: 12.2 g/dL — ABNORMAL LOW (ref 13.0–17.0)
MCH: 31.7 pg (ref 26.0–34.0)
MCHC: 34.9 g/dL (ref 30.0–36.0)
MCV: 90.9 fL (ref 80.0–100.0)
Platelets: 290 10*3/uL (ref 150–400)
RBC: 3.85 MIL/uL — ABNORMAL LOW (ref 4.22–5.81)
RDW: 12.4 % (ref 11.5–15.5)
WBC: 15.5 10*3/uL — ABNORMAL HIGH (ref 4.0–10.5)
nRBC: 0 % (ref 0.0–0.2)

## 2022-07-10 LAB — BETA-HYDROXYBUTYRIC ACID: Beta-Hydroxybutyric Acid: 0.14 mmol/L (ref 0.05–0.27)

## 2022-07-10 LAB — URINE CULTURE

## 2022-07-10 LAB — BASIC METABOLIC PANEL
Anion gap: 8 (ref 5–15)
BUN: 9 mg/dL (ref 6–20)
CO2: 27 mmol/L (ref 22–32)
Calcium: 7.9 mg/dL — ABNORMAL LOW (ref 8.9–10.3)
Chloride: 97 mmol/L — ABNORMAL LOW (ref 98–111)
Creatinine, Ser: 0.53 mg/dL — ABNORMAL LOW (ref 0.61–1.24)
GFR, Estimated: >60 mL/min (ref >60)
Glucose, Bld: 214 mg/dL — ABNORMAL HIGH (ref 70–99)
Potassium: 2.8 mmol/L — ABNORMAL LOW (ref 3.5–5.1)
Sodium: 132 mmol/L — ABNORMAL LOW (ref 135–145)

## 2022-07-10 LAB — HIV ANTIBODY (ROUTINE TESTING W REFLEX): HIV Screen 4th Generation wRfx: NONREACTIVE

## 2022-07-10 SURGERY — TURP (TRANSURETHRAL RESECTION OF PROSTATE)
Anesthesia: General | Site: Prostate

## 2022-07-10 MED ORDER — GLUCERNA SHAKE PO LIQD
237.0000 mL | Freq: Three times a day (TID) | ORAL | Status: DC
  Administered 2022-07-10 – 2022-07-18 (×24): 237 mL via ORAL
  Filled 2022-07-10 (×27): qty 237

## 2022-07-10 MED ORDER — HYDROMORPHONE HCL 1 MG/ML IJ SOLN: 0.5000 mg | Freq: Once | INTRAMUSCULAR | Status: DC

## 2022-07-10 MED ORDER — INSULIN GLARGINE-YFGN 100 UNIT/ML ~~LOC~~ SOLN
15.0000 [IU] | Freq: Every day | SUBCUTANEOUS | Status: DC
  Administered 2022-07-10: 15 [IU] via SUBCUTANEOUS
  Filled 2022-07-10 (×2): qty 0.15

## 2022-07-10 MED ORDER — LACTATED RINGERS IV SOLN: INTRAVENOUS | Status: DC | PRN

## 2022-07-10 MED ORDER — INSULIN ASPART 100 UNIT/ML IJ SOLN
7.0000 [IU] | Freq: Once | INTRAMUSCULAR | Status: AC
  Administered 2022-07-10: 7 [IU] via SUBCUTANEOUS

## 2022-07-10 MED ORDER — FENTANYL CITRATE (PF) 100 MCG/2ML IJ SOLN
INTRAMUSCULAR | Status: AC
  Filled 2022-07-10: qty 2

## 2022-07-10 MED ORDER — PROPOFOL 10 MG/ML IV BOLUS
INTRAVENOUS | Status: DC | PRN
  Administered 2022-07-10: 100 mg via INTRAVENOUS

## 2022-07-10 MED ORDER — MIDAZOLAM HCL 2 MG/2ML IJ SOLN
INTRAMUSCULAR | Status: AC
  Filled 2022-07-10: qty 2

## 2022-07-10 MED ORDER — SUCCINYLCHOLINE CHLORIDE 200 MG/10ML IV SOSY
PREFILLED_SYRINGE | INTRAVENOUS | Status: AC
  Filled 2022-07-10: qty 10

## 2022-07-10 MED ORDER — DEXMEDETOMIDINE HCL IN NACL 80 MCG/20ML IV SOLN
INTRAVENOUS | Status: AC
  Filled 2022-07-10: qty 20

## 2022-07-10 MED ORDER — SUCCINYLCHOLINE CHLORIDE 200 MG/10ML IV SOSY
PREFILLED_SYRINGE | INTRAVENOUS | Status: DC | PRN
  Administered 2022-07-10: 140 mg via INTRAVENOUS

## 2022-07-10 MED ORDER — PROPOFOL 10 MG/ML IV BOLUS
INTRAVENOUS | Status: AC
  Filled 2022-07-10: qty 20

## 2022-07-10 MED ORDER — SODIUM CHLORIDE 0.9 % IR SOLN
Status: DC | PRN
  Administered 2022-07-10 (×2): 6000 mL via INTRAVESICAL
  Administered 2022-07-10: 3000 mL via INTRAVESICAL

## 2022-07-10 MED ORDER — ONDANSETRON HCL 4 MG/2ML IJ SOLN
INTRAMUSCULAR | Status: AC
  Filled 2022-07-10: qty 2

## 2022-07-10 MED ORDER — MIDAZOLAM HCL 2 MG/2ML IJ SOLN
INTRAMUSCULAR | Status: DC | PRN
  Administered 2022-07-10: 1 mg via INTRAVENOUS

## 2022-07-10 MED ORDER — DEXMEDETOMIDINE HCL IN NACL 80 MCG/20ML IV SOLN
INTRAVENOUS | Status: DC | PRN
  Administered 2022-07-10: 4 ug via INTRAVENOUS
  Administered 2022-07-10 (×2): 8 ug via INTRAVENOUS

## 2022-07-10 MED ORDER — ADULT MULTIVITAMIN W/MINERALS CH
1.0000 | ORAL_TABLET | Freq: Every day | ORAL | Status: DC
  Administered 2022-07-10 – 2022-07-18 (×9): 1 via ORAL
  Filled 2022-07-10 (×9): qty 1

## 2022-07-10 MED ORDER — FENTANYL CITRATE (PF) 100 MCG/2ML IJ SOLN
INTRAMUSCULAR | Status: DC | PRN
  Administered 2022-07-10: 100 ug via INTRAVENOUS

## 2022-07-10 MED ORDER — PHENYLEPHRINE HCL-NACL 20-0.9 MG/250ML-% IV SOLN
INTRAVENOUS | Status: DC | PRN
  Administered 2022-07-10: 20 ug/min via INTRAVENOUS

## 2022-07-10 MED ORDER — CIPROFLOXACIN IN D5W 400 MG/200ML IV SOLN
INTRAVENOUS | Status: AC
  Filled 2022-07-10: qty 200

## 2022-07-10 MED ORDER — DEXAMETHASONE SODIUM PHOSPHATE 10 MG/ML IJ SOLN
INTRAMUSCULAR | Status: AC
  Filled 2022-07-10: qty 1

## 2022-07-10 MED ORDER — CHLORHEXIDINE GLUCONATE CLOTH 2 % EX PADS
6.0000 | MEDICATED_PAD | Freq: Every day | CUTANEOUS | Status: DC
  Administered 2022-07-10 – 2022-07-17 (×8): 6 via TOPICAL

## 2022-07-10 MED ORDER — LIDOCAINE HCL (PF) 2 % IJ SOLN
INTRAMUSCULAR | Status: AC
  Filled 2022-07-10: qty 5

## 2022-07-10 MED ORDER — LIDOCAINE 2% (20 MG/ML) 5 ML SYRINGE
INTRAMUSCULAR | Status: DC | PRN
  Administered 2022-07-10: 40 mg via INTRAVENOUS

## 2022-07-10 MED ORDER — HYDROMORPHONE HCL 1 MG/ML IJ SOLN
INTRAMUSCULAR | Status: AC
  Filled 2022-07-10: qty 1

## 2022-07-10 MED ORDER — ROCURONIUM BROMIDE 10 MG/ML (PF) SYRINGE
PREFILLED_SYRINGE | INTRAVENOUS | Status: DC | PRN
  Administered 2022-07-10: 50 mg via INTRAVENOUS
  Administered 2022-07-10: 10 mg via INTRAVENOUS

## 2022-07-10 MED ORDER — SUGAMMADEX SODIUM 200 MG/2ML IV SOLN
INTRAVENOUS | Status: DC | PRN
  Administered 2022-07-10: 150 mg via INTRAVENOUS

## 2022-07-10 MED ORDER — FENTANYL CITRATE PF 50 MCG/ML IJ SOSY: 25.0000 ug | PREFILLED_SYRINGE | INTRAMUSCULAR | Status: DC | PRN

## 2022-07-10 MED ORDER — CIPROFLOXACIN IN D5W 400 MG/200ML IV SOLN
INTRAVENOUS | Status: DC | PRN
  Administered 2022-07-10: 400 mg via INTRAVENOUS

## 2022-07-10 MED ORDER — ROCURONIUM BROMIDE 10 MG/ML (PF) SYRINGE
PREFILLED_SYRINGE | INTRAVENOUS | Status: AC
  Filled 2022-07-10: qty 10

## 2022-07-10 SURGICAL SUPPLY — 19 items
BAG URINE DRAIN 2000ML AR STRL (UROLOGICAL SUPPLIES) ×1 IMPLANT
BAG URO CATCHER STRL LF (MISCELLANEOUS) ×1 IMPLANT
CATH FOLEY 2WAY 5CC 20FR (CATHETERS) IMPLANT
CATH FOLEY 2WAY SLVR 30CC 20FR (CATHETERS) IMPLANT
DRAPE FOOT SWITCH (DRAPES) ×1 IMPLANT
EVACUATOR MICROVAS BLADDER (UROLOGICAL SUPPLIES) ×1 IMPLANT
GLOVE SURG LX STRL 7.5 STRW (GLOVE) ×1 IMPLANT
GOWN STRL REUS W/ TWL XL LVL3 (GOWN DISPOSABLE) ×1 IMPLANT
GOWN STRL REUS W/TWL XL LVL3 (GOWN DISPOSABLE) ×1
HOLDER FOLEY CATH W/STRAP (MISCELLANEOUS) IMPLANT
KIT TURNOVER KIT A (KITS) IMPLANT
LOOP CUT BIPOLAR 24F LRG (ELECTROSURGICAL) IMPLANT
MANIFOLD NEPTUNE II (INSTRUMENTS) ×1 IMPLANT
PACK CYSTO (CUSTOM PROCEDURE TRAY) ×1 IMPLANT
SYR 30ML LL (SYRINGE) IMPLANT
SYR TOOMEY IRRIG 70ML (MISCELLANEOUS) ×1
SYRINGE TOOMEY IRRIG 70ML (MISCELLANEOUS) ×1 IMPLANT
TUBING CONNECTING 10 (TUBING) ×1 IMPLANT
TUBING UROLOGY SET (TUBING) ×1 IMPLANT

## 2022-07-10 NOTE — Anesthesia Preprocedure Evaluation (Addendum)
Anesthesia Evaluation  Patient identified by MRN, date of birth, ID band Patient awake    Reviewed: Allergy & Precautions, NPO status , Patient's Chart, lab work & pertinent test results  Airway Mallampati: I  TM Distance: >3 FB Neck ROM: Full    Dental no notable dental hx. (+) Teeth Intact, Dental Advisory Given   Pulmonary Current Smoker and Patient abstained from smoking.   Pulmonary exam normal breath sounds clear to auscultation       Cardiovascular hypertension, Normal cardiovascular exam Rhythm:Regular Rate:Normal     Neuro/Psych negative neurological ROS  negative psych ROS   GI/Hepatic negative GI ROS,,,(+)     substance abuse  cocaine use  Endo/Other  diabetes (BG 239 at 0830), Poorly Controlled, Type 2    Renal/GU negative Renal ROS  negative genitourinary   Musculoskeletal negative musculoskeletal ROS (+)    Abdominal   Peds  Hematology negative hematology ROS (+)   Anesthesia Other Findings   Reproductive/Obstetrics                             Anesthesia Physical Anesthesia Plan  ASA: 2  Anesthesia Plan: General   Post-op Pain Management:    Induction: Intravenous  PONV Risk Score and Plan: 1 and Ondansetron, Dexamethasone and Midazolam  Airway Management Planned: Oral ETT  Additional Equipment:   Intra-op Plan:   Post-operative Plan: Extubation in OR  Informed Consent: I have reviewed the patients History and Physical, chart, labs and discussed the procedure including the risks, benefits and alternatives for the proposed anesthesia with the patient or authorized representative who has indicated his/her understanding and acceptance.     Dental advisory given  Plan Discussed with: CRNA  Anesthesia Plan Comments:        Anesthesia Quick Evaluation

## 2022-07-10 NOTE — Progress Notes (Signed)
Day of Surgery Subjective: Foley inserted yesterday and then removed d/t patient discomfort.   Objective: Vital signs in last 24 hours: Temp:  [97.7 F (36.5 C)-98.3 F (36.8 C)] 98.3 F (36.8 C) (05/11 0312) Pulse Rate:  [85-100] 85 (05/11 0312) Resp:  [13-23] 14 (05/11 0312) BP: (134-175)/(67-111) 143/87 (05/11 0312) SpO2:  [95 %-100 %] 96 % (05/11 0312) Weight:  [72.6 kg] 72.6 kg (05/10 1107)  Intake/Output from previous day: 05/10 0701 - 05/11 0700 In: 3125.6 [I.V.:871.1; IV Piggyback:2254.6] Out: 1500 [Urine:1500] Intake/Output this shift: No intake/output data recorded.  Physical Exam:  General: Alert and oriented CV: RRR Lungs: Clear Abdomen: Soft, ND GU: voiding spontaneously Ext: NT, No erythema  Lab Results: Recent Labs    07/09/22 1122 07/09/22 2053  HGB 15.9 13.2  HCT 44.3 37.7*   BMET Recent Labs    07/09/22 1248 07/09/22 2053  NA 133*  --   K 3.5  --   CL 91*  --   CO2 31  --   GLUCOSE 397*  --   BUN 7  --   CREATININE 0.70 0.74  CALCIUM 8.5*  --      Studies/Results: CT ABDOMEN PELVIS W CONTRAST  Result Date: 07/09/2022 CLINICAL DATA:  Anterior inferior abdominal pain for evaluation of pyelonephritis EXAM: CT ABDOMEN AND PELVIS WITH CONTRAST TECHNIQUE: Multidetector CT imaging of the abdomen and pelvis was performed using the standard protocol following bolus administration of intravenous contrast. RADIATION DOSE REDUCTION: This exam was performed according to the departmental dose-optimization program which includes automated exposure control, adjustment of the mA and/or kV according to patient size and/or use of iterative reconstruction technique. CONTRAST:  OMNIPAQUE IOHEXOL 300 MG/ML  SOLN COMPARISON:  None Available. FINDINGS: Lower chest: Left lower lobe ground-glass nodule measures 9 x 7 mm (5:29). Additional ill-defined ground-glass density on series 5, image 18. Additional scattered solid nodules measuring up to 3 mm in the right  (5:17) and left lower lobes (5:8,9). Triangular right middle lobe perifissural nodule (5:8) is most often an intrapulmonary lymph node. No specific follow-up imaging recommended. No pleural effusion or pneumothorax demonstrated. Partially imaged heart size is normal. Hepatobiliary: Hypoattenuation along the falciform ligament may reflect focal steatosis or perfusional variation. No intra or extrahepatic biliary ductal dilation. Normal gallbladder. Pancreas: No focal lesions or main ductal dilation. Spleen: Enlarged in the AP dimension measures 14.9 cm. No focal lesions. Adrenals/Urinary Tract: Nodular thickening of the right adrenal gland without discrete nodule. Left adrenal nodule. Thick-walled hypoenhancing exophytic lesion along the anterolateral left interpolar kidney measures 1.4 x 1.0 cm (2:38). No hydronephrosis or stones. Circumferential mural thickening and pericystic stranding. Stomach/Bowel: Normal appearance of the stomach. No evidence of bowel wall thickening, distention, or inflammatory changes. Normal appendix. Vascular/Lymphatic: No significant vascular findings are present. No enlarged abdominal or pelvic lymph nodes. Reproductive: Multiloculated rim enhancing fluid collections within the prostate gland measuring up to 5.7 x 3.3 cm in conglomerate (2:90). Other: No free fluid, fluid collection, or free air. Musculoskeletal: No acute or abnormal lytic or blastic osseous lesions. IMPRESSION: 1. Multiloculated rim enhancing fluid collections within the prostate gland measuring up to 5.7 x 3.3 cm in conglomerate, suspicious for abscesses. Cystic degeneration of BPH nodule and cystic neoplasm are considered much less likely in the setting of acute urinary tract infection. 2. Circumferential mural thickening and pericystic stranding, suggestive of cystitis. 3. Thick-walled hypoenhancing exophytic lesion along the anterolateral left interpolar kidney measures 1.4 x 1.0 cm, suspicious for hypoenhancing  primary renal  neoplasm. Recommend further evaluation with nonemergent renal mass protocol MRI or CT. 4. Left lower lobe ground-glass nodule measures 9 x 7 mm, likely infectious/inflammatory. Additional scattered solid nodules measuring up to 3 mm in the bilateral lower lobes. Non-contrast chest CT at 3-6 months is recommended. If nodules persist, subsequent management will be based upon the most suspicious nodule(s). This recommendation follows the consensus statement: Guidelines for Management of Incidental Pulmonary Nodules Detected on CT Images: From the Fleischner Society 2017; Radiology 2017; 284:228-243. 5. Splenomegaly. Electronically Signed   By: Agustin Cree M.D.   On: 07/09/2022 16:34   DG Chest 2 View  Result Date: 07/09/2022 CLINICAL DATA:  Five day history of shortness of breath EXAM: CHEST - 2 VIEW COMPARISON:  Chest radiograph dated 06/28/2015 FINDINGS: Low lung volumes. No focal consolidations. No pleural effusion or pneumothorax. The heart size and mediastinal contours are within normal limits. No acute osseous abnormality. IMPRESSION: No active cardiopulmonary disease. Electronically Signed   By: Agustin Cree M.D.   On: 07/09/2022 12:10    Assessment/Plan: #Prostate abscess - OR for TURP, prostate abscess unroofing today, 07/10/22 with Dr. Mena Goes. Discussed + cocaine on Utox with anesthesia, no contraindication to GA unless acutely intoxicated - Agree with IV CTX for now; if not responding well on that regimen, consider transition to fluoroquinolone or broad-spectrum beta lactam with or without gent   #Left renal lesion - suspicious for malignancy; will discuss further with patient while in-house and set-up for outpatient surveillance and discussions regarding management   #Genital lesions - Possibly HSV - GC/CT, HIV ordered and pending   #Hyperglycemia - Per primary team   LOS: 1 day   Alejandro Santiago 07/10/2022, 7:25 AM

## 2022-07-10 NOTE — Discharge Instructions (Signed)

## 2022-07-10 NOTE — Progress Notes (Signed)
Initial Nutrition Assessment  INTERVENTION:   -Glucerna Shake po TID, each supplement provides 220 kcal and 10 grams of protein    -Multivitamin with minerals daily  -Provide "Carbohydrate Counting" handout in AVS. Will provide review of diet in person.  NUTRITION DIAGNOSIS:   Increased nutrient needs related to chronic illness as evidenced by estimated needs.  GOAL:   Patient will meet greater than or equal to 90% of their needs  MONITOR:   PO intake, Supplement acceptance, Labs, Weight trends, I & O's  REASON FOR ASSESSMENT:   Consult Assessment of nutrition requirement/status  ASSESSMENT:   47 y.o. male presenting withweight loss for the past 2 to 3 months.  He reports for the past 5 days he has had fatigue, vomiting, excessive thirst, urination, constipation, cough, shortness of breath. Admitted with prostate abscesses. Diagnosed with diabetes this admission.  Patient is s/p TURP this morning. Given pt's symptoms over the past week, substance abuse and not eating as well will order Glucerna shakes and daily MVI. Will review diabetes diet at a later date in person.   Per weight records, pt's weight has been trending down since 2018. No recent weights to review since 2021. Pt reporting most recent weight loss occurred over the past 2-3 months.   Medications: Zofran  Labs reviewed: CBGs: 183-433 HgbA1c:  9.9 UDS+ cocaine  NUTRITION - FOCUSED PHYSICAL EXAM:  Unable to complete at this time, RD working remotely  Diet Order:   Diet Order             Diet Carb Modified Fluid consistency: Thin; Room service appropriate? Yes  Diet effective now                   EDUCATION NEEDS:   Not appropriate for education at this time  Skin:  Skin Assessment: Reviewed RN Assessment  Last BM:  5/7  Height:   Ht Readings from Last 1 Encounters:  07/09/22 6' (1.829 m)    Weight:   Wt Readings from Last 1 Encounters:  07/09/22 72.6 kg    BMI:  Body mass  index is 21.7 kg/m.  Estimated Nutritional Needs:   Kcal:  1800-2000  Protein:  80-90g  Fluid:  2L/day   Tilda Franco, MS, RD, LDN Inpatient Clinical Dietitian Contact information available via Amion

## 2022-07-10 NOTE — Transfer of Care (Signed)
Immediate Anesthesia Transfer of Care Note  Patient: Alejandro Santiago  Procedure(s) Performed: TRANSURETHRAL RESECTION OF THE PROSTATE (TURP) (Prostate)  Patient Location: PACU  Anesthesia Type:General  Level of Consciousness: awake, alert , and patient cooperative  Airway & Oxygen Therapy: Patient Spontanous Breathing and Patient connected to face mask oxygen  Post-op Assessment: Report given to RN and Post -op Vital signs reviewed and stable  Post vital signs: Reviewed and stable  Last Vitals:  Vitals Value Taken Time  BP 161/90 07/10/22 1043  Temp    Pulse 86 07/10/22 1044  Resp 17 07/10/22 1044  SpO2 100 % 07/10/22 1044  Vitals shown include unvalidated device data.  Last Pain:  Vitals:   07/10/22 0800  TempSrc:   PainSc: 10-Worst pain ever      Patients Stated Pain Goal: 5 (07/10/22 0511)  Complications: No notable events documented.

## 2022-07-10 NOTE — Anesthesia Postprocedure Evaluation (Signed)
Anesthesia Post Note  Patient: Alejandro Santiago  Procedure(s) Performed: TRANSURETHRAL RESECTION OF THE PROSTATE (TURP) (Prostate)     Patient location during evaluation: PACU Anesthesia Type: General Level of consciousness: awake and alert Pain management: pain level controlled Vital Signs Assessment: post-procedure vital signs reviewed and stable Respiratory status: spontaneous breathing, nonlabored ventilation, respiratory function stable and patient connected to nasal cannula oxygen Cardiovascular status: blood pressure returned to baseline and stable Postop Assessment: no apparent nausea or vomiting Anesthetic complications: no  No notable events documented.  Last Vitals:  Vitals:   07/10/22 1113 07/10/22 1127  BP: (!) 146/87 (!) 140/78  Pulse: 80 89  Resp: 14 14  Temp: 36.5 C (!) 36.4 C  SpO2: 100% 100%    Last Pain:  Vitals:   07/10/22 1127  TempSrc: Oral  PainSc: 0-No pain                 Maley Venezia L Esra Frankowski

## 2022-07-10 NOTE — Anesthesia Procedure Notes (Signed)
Procedure Name: Intubation Date/Time: 07/10/2022 9:24 AM  Performed by: Sindy Guadeloupe, CRNAPre-anesthesia Checklist: Patient identified, Emergency Drugs available, Suction available, Patient being monitored and Timeout performed Patient Re-evaluated:Patient Re-evaluated prior to induction Oxygen Delivery Method: Circle system utilized Preoxygenation: Pre-oxygenation with 100% oxygen Induction Type: IV induction and Rapid sequence Laryngoscope Size: Mac and 4 Grade View: Grade I Tube type: Oral Tube size: 7.5 mm Number of attempts: 1 Airway Equipment and Method: Stylet Placement Confirmation: ETT inserted through vocal cords under direct vision, positive ETCO2 and breath sounds checked- equal and bilateral Secured at: 22 cm Tube secured with: Tape Dental Injury: Teeth and Oropharynx as per pre-operative assessment

## 2022-07-10 NOTE — Inpatient Diabetes Management (Signed)
Inpatient Diabetes Program Recommendations  AACE/ADA: New Consensus Statement on Inpatient Glycemic Control (2015)  Target Ranges:  Prepandial:   less than 140 mg/dL      Peak postprandial:   less than 180 mg/dL (1-2 hours)      Critically ill patients:  140 - 180 mg/dL   Lab Results  Component Value Date   GLUCAP 230 (H) 07/10/2022   HGBA1C 9.9 (H) 07/09/2022    Review of Glycemic Control  Latest Reference Range & Units 07/10/22 07:30 07/10/22 10:45 07/10/22 11:38 07/10/22 16:38  Glucose-Capillary 70 - 99 mg/dL 696 (H) 295 (H) 284 (H) 230 (H)   Diabetes history: New Dx. Of DM based on A1C  Current orders for Inpatient glycemic control:  Novolog 0-15 units tid with meals Semglee 15 units daily Inpatient Diabetes Program Recommendations:    Agree with addition of Semglee daily.  A1C indicates new diagnosis of DM.  DM coordinator will f/u on 5/13 to further discuss A1C and new diagnosis.   Thanks,  Beryl Meager, RN, BC-ADM Inpatient Diabetes Coordinator Pager [phone]  (8a-5p)

## 2022-07-10 NOTE — Op Note (Signed)
Preoperative diagnosis: Prostate abscess Postoperative diagnosis: Same  Procedure: TURP  Surgeon: Mena Goes  Assistant: Emmerling  Anesthesia: General  Indication for procedure: Alejandro Santiago is a 47 year old male presented with severe pelvic pain radiating into his rectal area and urethra.  CT scan revealed a large prostatic abscess.  Findings: On exam under anesthesia the penis was circumcised without mass or lesion.  The glans and meatus appeared normal.  On the penile skin there were some prior lesions that were well-healed scattered about maybe 4-5 5 mm lesions.  These were flat and slightly pigmented simple appearing areas.  The scrotum appeared normal without lesion.  Testicles descended bilaterally and palpably normal.  On digital rectal exam with light pressure there was a bulging right prostatic fluid collection.  Following TURP and unroofing rectal exam was repeated and with DRE more pus was pushed out of the cavity but we confirmed good connection and unroofing of the cavity apically.  Also the more posterior and lateral right cavity was unroofed.  All cystoscopy the urethra was unremarkable, the prostatic urethra was actually unremarkable and patent.  The bladder was a large capacity bladder with no mucosal lesions.  No stone or foreign body in the bladder.  Ureteral orifice ease appeared in their normal orthotopic position with clear efflux.  Description of procedure: After consent was obtained patient brought to the operating room.  After adequate anesthesia he was placed lithotomy position and prepped and draped in the usual sterile fashion.  Timeout was performed to confirm the patient and procedure.  Exam under anesthesia was performed.  Cystoscopy was performed with findings as above.  We then swapped that out for the continuous-flow sheath with the visual obturator and then swapped that out for the loop and handle.  We came inside the bladder neck about a centimeter and started at the  the right lateral prostate about 7 or 8:00 and began resection.  We then began to see pus extruding from the tissue and followed this down into a posterior right cavity posterior mid cavity.  We then brought this down more toward the veru more towards 7:00 and opened up the large apical cavity.  We parked the scope in the mid prostatic urethra and a stream of thick purulence exited the cavity and drained into the bladder.  Once the purulence stopped flowing, the bladder was drained and a urine sent for culture.  We then went back into the cavity and unroofed it a slight amount more laterally and apically to make sure we drained all pockets as well as we could.  With the scope in the prostatic urethra repeat DRE was performed.  The abscess was no longer palpable but pressure on the prostate drained more pus confirming connection with the cavities.  Prostate massage and irrigation was performed to flush out the cavities and the tissue.  Any chips and pus were then drained out of the bladder.  Fulguration in the prostate was done to ensure adequate hemostasis at low pressure.  The only resection was the right lateral lobe more toward the 7:00 direction and apically.  Bladder neck was not resected or fulgurated.  Posterior left and anterior not resected or fulgurated.  Checking the bladder 1 more time it was noted to be a large capacity but no further debris or chips.  The scope was removed and a 20 [place] two-way catheter was advanced without difficulty.  10 cc put in the balloon and it was seated at the bladder neck.  Irrigation was clear.  He was awakened taken recovery room in stable condition.  Complications: None  Blood loss: Minimal  Specimens: Catheter bladder urine culture to lab  Drains: 20 French Foley catheter  Disposition: Patient stable to PACU-will continue catheter for about 5 days and consider voiding trial.

## 2022-07-10 NOTE — Progress Notes (Signed)
[facility] Progress Note  Patient: Alejandro Santiago     LKG:[id number]  DOA: 07/09/2022   PCP: Patient, No Pcp Per       Brief hospital course: Arnaz Sprauer is a 47 y.o. male with no medical history who began to have pain in his "urethra" about 3 days ago. He has dysuria. No hematuria. He has had sweats and chills. Pain is currently in scrotum and anus and is 10/10 right now.  No discharge. He has one male partner over the last 12 years and this is his girlfriend. No h/o STDs.  He is also noted to have elevated glucose levels and is not aware that he is a diabetic.  As far as he knows, he does not have any medical problems and does not have a PCP    ED Course:  CT abdomen pelvis-multiloculated rim-enhancing fluid collections within the prostate gland measuring up to 5.7 x 3.3 cm - Circumferential mural thickening and pericystic stranding suggestive of cystitis - Thick-walled hypoenhancing exophytic lesion in the left kidney suspicious for primary renal neoplasm - Left lower lobe groundglass nodule with additional scattered solid nodules in bilateral lower lobes   Sodium 133, chloride 91 WBC 16.8 Hemoglobin A1c 9.9 UA reveals WBC, nitrites, ketones and glucose but no bacteria noted   Cocaine positive Also noted on exam are numerous maculopapular lesions which he states started about 2 weeks ago.  He states that he does not use any IV drugs.  Subjective:  Less pain in perineum/ scrotum  Assessment and Plan: Principal Problem:   Prostate abscesses - s/p I and D today- f/u cultures - HIV non reactive - GC/ Chlamydia not collected by staff  Active Problems:   Diabetes mellitus type 2 in nonobese (HCC) -Not in DKA - A1c 9.9 - Cont insulin sliding scale, diabetic diet - Consulted diabetes coordinator and dietitian   Extensive maculopapular rash - bacteremia? -Interestingly the rash is not on his back but is on all other parts of the body and therefore may be  related to scratching - pustular lesion on face- have not expressed this   Nicotine abuse -Smokes half a pack a day - Nicotine patch ordered   Cocaine abuse - Counseled - he does not admit to any other illicit drug use   Lung nodules - Will need to follow-up as outpatient   Hyponatremia - He admits to poor oral intake + hyperglycemia over the past couple of days - cont cont IVF      Code Status: Full Code Consultants: urology Level of Care: Level of care: Med-Surg Total time on patient care: 35 min DVT prophylaxis:  enoxaparin (LOVENOX) injection 40 mg Start: 07/09/22 2200 Place and maintain sequential compression device Start: 07/09/22 1847     Objective:   Vitals:   07/10/22 1045 07/10/22 1100 07/10/22 1113 07/10/22 1127  BP: (!) 163/92 (!) 155/93 (!) 146/87 (!) 140/78  Pulse: 86 86 80 89  Resp: 17 14 14 14   Temp:   97.7 F (36.5 C) (!) 97.5 F (36.4 C)  TempSrc:    Oral  SpO2: 100% 100% 100% 100%  Weight:      Height:       Filed Weights   07/09/22 1107  Weight: 72.6 kg   Exam: General exam: Appears comfortable  HEENT: oral mucosa moist Respiratory system: Clear to auscultation.  Cardiovascular system: S1 & S2 heard  Gastrointestinal system: Abdomen soft, non-tender, nondistended. Normal bowel sounds   Extremities: No cyanosis, clubbing or  edema Skin maculopapular rash improving- still has a pustular lesion on right face with a scab over it Psychiatry:  Mood & affect appropriate.      CBC: Recent Labs  Lab 07/09/22 1122 07/09/22 2053  WBC 16.8* 17.8*  NEUTROABS 14.4*  --   HGB 15.9 13.2  HCT 44.3 37.7*  MCV 87.9 89.5  PLT 396 339   Basic Metabolic Panel: Recent Labs  Lab 07/09/22 1248 07/09/22 2053  NA 133*  --   K 3.5  --   CL 91*  --   CO2 31  --   GLUCOSE 397*  --   BUN 7  --   CREATININE 0.70 0.74  CALCIUM 8.5*  --    GFR: Estimated Creatinine Clearance: 117.2 mL/min (by C-G formula based on SCr of 0.74 mg/dL).  Scheduled  Meds:  Chlorhexidine Gluconate Cloth  6 each Topical Daily   enoxaparin (LOVENOX) injection  40 mg Subcutaneous Q24H   HYDROmorphone        HYDROmorphone (DILAUDID) injection  0.5 mg Intravenous Once   insulin aspart  0-15 Units Subcutaneous TID WC   insulin glargine-yfgn  15 Units Subcutaneous Daily   nicotine  14 mg Transdermal Q24H   Continuous Infusions:  sodium chloride 100 mL/hr at 07/10/22 1137   cefTRIAXone (ROCEPHIN)  IV     Imaging and lab data was personally reviewed CT ABDOMEN PELVIS W CONTRAST  Result Date: 07/09/2022 CLINICAL DATA:  Anterior inferior abdominal pain for evaluation of pyelonephritis EXAM: CT ABDOMEN AND PELVIS WITH CONTRAST TECHNIQUE: Multidetector CT imaging of the abdomen and pelvis was performed using the standard protocol following bolus administration of intravenous contrast. RADIATION DOSE REDUCTION: This exam was performed according to the departmental dose-optimization program which includes automated exposure control, adjustment of the mA and/or kV according to patient size and/or use of iterative reconstruction technique. CONTRAST:  OMNIPAQUE IOHEXOL 300 MG/ML  SOLN COMPARISON:  None Available. FINDINGS: Lower chest: Left lower lobe ground-glass nodule measures 9 x 7 mm (5:29). Additional ill-defined ground-glass density on series 5, image 18. Additional scattered solid nodules measuring up to 3 mm in the right (5:17) and left lower lobes (5:8,9). Triangular right middle lobe perifissural nodule (5:8) is most often an intrapulmonary lymph node. No specific follow-up imaging recommended. No pleural effusion or pneumothorax demonstrated. Partially imaged heart size is normal. Hepatobiliary: Hypoattenuation along the falciform ligament may reflect focal steatosis or perfusional variation. No intra or extrahepatic biliary ductal dilation. Normal gallbladder. Pancreas: No focal lesions or main ductal dilation. Spleen: Enlarged in the AP dimension measures 14.9  cm. No focal lesions. Adrenals/Urinary Tract: Nodular thickening of the right adrenal gland without discrete nodule. Left adrenal nodule. Thick-walled hypoenhancing exophytic lesion along the anterolateral left interpolar kidney measures 1.4 x 1.0 cm (2:38). No hydronephrosis or stones. Circumferential mural thickening and pericystic stranding. Stomach/Bowel: Normal appearance of the stomach. No evidence of bowel wall thickening, distention, or inflammatory changes. Normal appendix. Vascular/Lymphatic: No significant vascular findings are present. No enlarged abdominal or pelvic lymph nodes. Reproductive: Multiloculated rim enhancing fluid collections within the prostate gland measuring up to 5.7 x 3.3 cm in conglomerate (2:90). Other: No free fluid, fluid collection, or free air. Musculoskeletal: No acute or abnormal lytic or blastic osseous lesions. IMPRESSION: 1. Multiloculated rim enhancing fluid collections within the prostate gland measuring up to 5.7 x 3.3 cm in conglomerate, suspicious for abscesses. Cystic degeneration of BPH nodule and cystic neoplasm are considered much less likely in the setting of acute  urinary tract infection. 2. Circumferential mural thickening and pericystic stranding, suggestive of cystitis. 3. Thick-walled hypoenhancing exophytic lesion along the anterolateral left interpolar kidney measures 1.4 x 1.0 cm, suspicious for hypoenhancing primary renal neoplasm. Recommend further evaluation with nonemergent renal mass protocol MRI or CT. 4. Left lower lobe ground-glass nodule measures 9 x 7 mm, likely infectious/inflammatory. Additional scattered solid nodules measuring up to 3 mm in the bilateral lower lobes. Non-contrast chest CT at 3-6 months is recommended. If nodules persist, subsequent management will be based upon the most suspicious nodule(s). This recommendation follows the consensus statement: Guidelines for Management of Incidental Pulmonary Nodules Detected on CT Images:  From the Fleischner Society 2017; Radiology 2017; 284:228-243. 5. Splenomegaly. Electronically Signed   By: Agustin Cree M.D.   On: 07/09/2022 16:34   DG Chest 2 View  Result Date: 07/09/2022 CLINICAL DATA:  Five day history of shortness of breath EXAM: CHEST - 2 VIEW COMPARISON:  Chest radiograph dated 06/28/2015 FINDINGS: Low lung volumes. No focal consolidations. No pleural effusion or pneumothorax. The heart size and mediastinal contours are within normal limits. No acute osseous abnormality. IMPRESSION: No active cardiopulmonary disease. Electronically Signed   By: Agustin Cree M.D.   On: 07/09/2022 12:10    LOS: 1 day   Author: Calvert Cantor  07/10/2022 12:54 PM  To contact [facility]>   Check the care team in [facility] and look for the attending/consulting [facility] provider listed  Log into [email or web address] and use [facility]'s universal password   Go to> "[facility]"  and find provider  If you still have difficulty reaching the provider, please page the [facility] (Director on Call) for the Hospitalists listed on amion

## 2022-07-11 ENCOUNTER — Encounter (HOSPITAL_COMMUNITY): Payer: Self-pay | Admitting: Urology

## 2022-07-11 LAB — URINE CULTURE: Culture: >=100000 — AB

## 2022-07-11 LAB — GLUCOSE, CAPILLARY
Glucose-Capillary: 233 mg/dL — ABNORMAL HIGH (ref 70–99)
Glucose-Capillary: 270 mg/dL — ABNORMAL HIGH (ref 70–99)
Glucose-Capillary: 253 mg/dL — ABNORMAL HIGH (ref 70–99)
Glucose-Capillary: 194 mg/dL — ABNORMAL HIGH (ref 70–99)
Glucose-Capillary: 218 mg/dL — ABNORMAL HIGH (ref 70–99)

## 2022-07-11 LAB — CULTURE, BLOOD (ROUTINE X 2)
Culture: NO GROWTH
Special Requests: ADEQUATE

## 2022-07-11 LAB — MAGNESIUM: Magnesium: 1.7 mg/dL (ref 1.7–2.4)

## 2022-07-11 MED ORDER — HYDROCODONE-ACETAMINOPHEN 5-325 MG PO TABS
1.0000 | ORAL_TABLET | ORAL | Status: DC | PRN
  Administered 2022-07-11 – 2022-07-18 (×33): 1 via ORAL
  Filled 2022-07-11 (×33): qty 1

## 2022-07-11 MED ORDER — POTASSIUM CHLORIDE 10 MEQ/100ML IV SOLN
10.0000 meq | INTRAVENOUS | Status: AC
  Filled 2022-07-11: qty 100

## 2022-07-11 MED ORDER — POTASSIUM CHLORIDE CRYS ER 20 MEQ PO TBCR
40.0000 meq | EXTENDED_RELEASE_TABLET | ORAL | Status: AC
  Administered 2022-07-11 (×2): 40 meq via ORAL
  Filled 2022-07-11 (×2): qty 2

## 2022-07-11 MED ORDER — VANCOMYCIN HCL 1500 MG/300ML IV SOLN
1500.0000 mg | Freq: Two times a day (BID) | INTRAVENOUS | Status: DC
  Administered 2022-07-11 – 2022-07-15 (×9): 1500 mg via INTRAVENOUS
  Filled 2022-07-11 (×10): qty 300

## 2022-07-11 MED ORDER — INSULIN GLARGINE-YFGN 100 UNIT/ML ~~LOC~~ SOLN
20.0000 [IU] | Freq: Every day | SUBCUTANEOUS | Status: DC
  Administered 2022-07-11 – 2022-07-13 (×3): 20 [IU] via SUBCUTANEOUS
  Filled 2022-07-11 (×4): qty 0.2

## 2022-07-11 MED ORDER — BISACODYL 5 MG PO TBEC
5.0000 mg | DELAYED_RELEASE_TABLET | Freq: Every day | ORAL | Status: DC | PRN
  Administered 2022-07-11 – 2022-07-13 (×2): 5 mg via ORAL
  Filled 2022-07-11 (×2): qty 1

## 2022-07-11 NOTE — Progress Notes (Signed)
1 Day Post-Op Subjective: S/p OR for transurethral unroofing of prostate. Foley catheter in place, urine is cloudy yellow. Patient reports immense improvement in perineal and rectal pain. Asking if it will be easier to have BM now that abscess treated  Objective: Vital signs in last 24 hours: Temp:  [98 F (36.7 C)-99.2 F (37.3 C)] 98.3 F (36.8 C) (05/12 0752) Pulse Rate:  [83-88] 88 (05/12 0752) Resp:  [18-20] 20 (05/12 0752) BP: (126-145)/(78-92) 126/78 (05/12 0752) SpO2:  [97 %-99 %] 98 % (05/12 0752)  Intake/Output from previous day: 05/11 0701 - 05/12 0700 In: 2935.6 [P.O.:960; I.V.:1775.6; IV Piggyback:200] Out: 3750 [Urine:3700; Blood:50] Intake/Output this shift: Total I/O In: 420 [P.O.:120; I.V.:300] Out: 250 [Urine:250]  Physical Exam:  General: Alert and oriented CV: RRR Lungs: Clear Abdomen: Soft, ND GU: foley catheter draining cloudy yellow Ext: NT, No erythema  Lab Results: Recent Labs    07/09/22 1122 07/09/22 2053 07/10/22 1235  HGB 15.9 13.2 12.2*  HCT 44.3 37.7* 35.0*    BMET Recent Labs    07/09/22 1248 07/09/22 2053 07/10/22 1235  NA 133*  --  132*  K 3.5  --  2.8*  CL 91*  --  97*  CO2 31  --  27  GLUCOSE 397*  --  214*  BUN 7  --  9  CREATININE 0.70 0.74 0.53*  CALCIUM 8.5*  --  7.9*      Studies/Results: CT ABDOMEN PELVIS W CONTRAST  Result Date: 07/09/2022 CLINICAL DATA:  Anterior inferior abdominal pain for evaluation of pyelonephritis EXAM: CT ABDOMEN AND PELVIS WITH CONTRAST TECHNIQUE: Multidetector CT imaging of the abdomen and pelvis was performed using the standard protocol following bolus administration of intravenous contrast. RADIATION DOSE REDUCTION: This exam was performed according to the departmental dose-optimization program which includes automated exposure control, adjustment of the mA and/or kV according to patient size and/or use of iterative reconstruction technique. CONTRAST:  OMNIPAQUE IOHEXOL 300  MG/ML  SOLN COMPARISON:  None Available. FINDINGS: Lower chest: Left lower lobe ground-glass nodule measures 9 x 7 mm (5:29). Additional ill-defined ground-glass density on series 5, image 18. Additional scattered solid nodules measuring up to 3 mm in the right (5:17) and left lower lobes (5:8,9). Triangular right middle lobe perifissural nodule (5:8) is most often an intrapulmonary lymph node. No specific follow-up imaging recommended. No pleural effusion or pneumothorax demonstrated. Partially imaged heart size is normal. Hepatobiliary: Hypoattenuation along the falciform ligament may reflect focal steatosis or perfusional variation. No intra or extrahepatic biliary ductal dilation. Normal gallbladder. Pancreas: No focal lesions or main ductal dilation. Spleen: Enlarged in the AP dimension measures 14.9 cm. No focal lesions. Adrenals/Urinary Tract: Nodular thickening of the right adrenal gland without discrete nodule. Left adrenal nodule. Thick-walled hypoenhancing exophytic lesion along the anterolateral left interpolar kidney measures 1.4 x 1.0 cm (2:38). No hydronephrosis or stones. Circumferential mural thickening and pericystic stranding. Stomach/Bowel: Normal appearance of the stomach. No evidence of bowel wall thickening, distention, or inflammatory changes. Normal appendix. Vascular/Lymphatic: No significant vascular findings are present. No enlarged abdominal or pelvic lymph nodes. Reproductive: Multiloculated rim enhancing fluid collections within the prostate gland measuring up to 5.7 x 3.3 cm in conglomerate (2:90). Other: No free fluid, fluid collection, or free air. Musculoskeletal: No acute or abnormal lytic or blastic osseous lesions. IMPRESSION: 1. Multiloculated rim enhancing fluid collections within the prostate gland measuring up to 5.7 x 3.3 cm in conglomerate, suspicious for abscesses. Cystic degeneration of BPH nodule and cystic neoplasm  are considered much less likely in the setting of  acute urinary tract infection. 2. Circumferential mural thickening and pericystic stranding, suggestive of cystitis. 3. Thick-walled hypoenhancing exophytic lesion along the anterolateral left interpolar kidney measures 1.4 x 1.0 cm, suspicious for hypoenhancing primary renal neoplasm. Recommend further evaluation with nonemergent renal mass protocol MRI or CT. 4. Left lower lobe ground-glass nodule measures 9 x 7 mm, likely infectious/inflammatory. Additional scattered solid nodules measuring up to 3 mm in the bilateral lower lobes. Non-contrast chest CT at 3-6 months is recommended. If nodules persist, subsequent management will be based upon the most suspicious nodule(s). This recommendation follows the consensus statement: Guidelines for Management of Incidental Pulmonary Nodules Detected on CT Images: From the Fleischner Society 2017; Radiology 2017; 284:228-243. 5. Splenomegaly. Electronically Signed   By: Agustin Cree M.D.   On: 07/09/2022 16:34    Assessment/Plan: #Prostate abscess - S/p OR for unroofing on 07/10/22 - Continue foley catheter to drainage through 07/15/22 - BCX NGTD; Ucx + MRSA - IV CTX + Vanc added today - Follow-up culture sensitivities, cater accordingly - Recommend 4 week course of antibiotic therapy    #Left renal lesion - suspicious for malignancy; will discuss further with patient while in-house and set-up for outpatient surveillance and discussions regarding management   #Hyperglycemia - Per primary team   LOS: 2 days   Kashdyn Westmeyer 07/11/2022, 12:05 PM

## 2022-07-11 NOTE — Progress Notes (Signed)
The Pt ambulated 100 feet in the hall way, and tol it well. Plan of care ongoing.

## 2022-07-11 NOTE — Progress Notes (Addendum)
PO Potassium and IV Potassium where started as ordered. Pt called me saying that he is having an allergic reaction. He was sweaty and feeling some burning at the IV site. I explained that its unlikely an allergic reaction and that that's more likely expected side effect, burning from IV potassium and offer to start at the lower rate potassium.Patient was informed of expected side effect prior to starting an IV infusion. Pt stated that he does not want more IV and only PO. Educated him again on expected side effect and how we can help that and about low potassium. Pt still refusing IV, MD notified and aware

## 2022-07-11 NOTE — Progress Notes (Signed)
Pharmacy Antibiotic Note  Alejandro Santiago is a 47 y.o. male admitted on 07/09/2022 with sepsis.  Pharmacy has been consulted for Vanco dosing.  Active Problem(s): pain in his "urethra" about 3 days ago, scrotum and anus. No discharge. He has dysuria, sweats and chills   PMH: +cocaine, tobacco,   Significant events: numerous maculopapular lesions which he states started about 2 weeks ago.   ID: Sepsis, cystitis, prostate abscess, rash of unknown origin and significance - CT abdomen pelvis-multiloculated rim-enhancing fluid collections within the prostate gland measuring up to 5.7 x 3.3 cm- Circumferential mural thickening and pericystic stranding suggestive of cystitis  Extensive maculopapular rash - bacteremia? Blood cultures neg so far -Interestingly the rash is not on his back but is on all other parts of the body and therefore may be related to scratching - pustular lesion on face- have not expressed this  - Tmax 99.2, WBC 15.5., Scr <1  Rocephin 5/10>> Vanco 5/12>>  - HIV non reactive  5/10: >100,000 SA 5/10: BC x 2>>  Plan: Rocephin 2g IV q24h Vancomycin 1500 mg IV Q 12 hrs. Goal AUC 400-550. Expected AUC: 529 SCr used: 0.8     Height: 6' (182.9 cm) Weight: 72.6 kg (160 lb) IBW/kg (Calculated) : 77.6  Temp (24hrs), Avg:98.3 F (36.8 C), Min:97.5 F (36.4 C), Max:99.2 F (37.3 C)  Recent Labs  Lab 07/09/22 1122 07/09/22 1248 07/09/22 2053 07/10/22 1235  WBC 16.8*  --  17.8* 15.5*  CREATININE  --  0.70 0.74 0.53*    Estimated Creatinine Clearance: 117.2 mL/min (A) (by C-G formula based on SCr of 0.53 mg/dL (L)).    No Known Allergies  Nilsa Macht S. Merilynn Finland, PharmD, BCPS Clinical Staff Pharmacist [email or web address]  Pasty Spillers 07/11/2022 11:03 AM

## 2022-07-11 NOTE — Progress Notes (Signed)
[facility] Progress Note  Patient: Alejandro Santiago     EAV:[id number]  DOA: 07/09/2022   PCP: Patient, No Pcp Per       Brief hospital course: Alejandro Santiago is a 47 y.o. male with no medical history who began to have pain in his "urethra" about 3 days ago. He has dysuria. No hematuria. He has had sweats and chills. Pain is currently in scrotum and anus and is 10/10 right now.  No discharge. He has one male partner over the last 12 years and this is his girlfriend. No h/o STDs.  He is also noted to have elevated glucose levels and is not aware that he is a diabetic.  As far as he knows, he does not have any medical problems and does not have a PCP    ED Course:  CT abdomen pelvis-multiloculated rim-enhancing fluid collections within the prostate gland measuring up to 5.7 x 3.3 cm - Circumferential mural thickening and pericystic stranding suggestive of cystitis - Thick-walled hypoenhancing exophytic lesion in the left kidney suspicious for primary renal neoplasm - Left lower lobe groundglass nodule with additional scattered solid nodules in bilateral lower lobes   Sodium 133, chloride 91 WBC 16.8 Hemoglobin A1c 9.9 UA reveals WBC, nitrites, ketones and glucose but no bacteria noted   Cocaine positive Also noted on exam are numerous maculopapular lesions which he states started about 2 weeks ago.  He states that he does not use any IV drugs.  Subjective:  Minimal pain in scrotum/ perineum. Has a foley can this is mildly irritating. Has no other complaints.   Assessment and Plan: Principal Problem:   Prostate abscesses - 5/11 I and D today  - HIV non reactive - GC/ Chlamydia not collected by staff - U culture : staph aureus- no sensitivities-  blood cultures neg - start Vanc today   Active Problems:  Extensive maculopapular rash - bacteremia? Blood cultures neg -Interestingly the rash is not on his back but is on all other parts of the body and therefore may be  related to scratching - pustular lesion on face- have not expressed this    Diabetes mellitus type 2 in nonobese (HCC) -Not in DKA - A1c 9.9 - Cont insulin sliding scale, diabetic diet - Consulted diabetes coordinator and dietitian  Hypokalemia -  replace- check Mg    Nicotine abuse -Smokes half a pack a day - Nicotine patch ordered   Cocaine abuse - Counseled - he does not admit to any other illicit drug use   Lung nodules - Will need to follow-up as outpatient- discussed with patinet   Hyponatremia - He admits to poor oral intake + hyperglycemia over the past couple of days - cont cont IVF      Code Status: Full Code Consultants: urology Level of Care: Level of care: Med-Surg Total time on patient care: 35 min DVT prophylaxis:  enoxaparin (LOVENOX) injection 40 mg Start: 07/09/22 2200 Place and maintain sequential compression device Start: 07/09/22 1847     Objective:   Vitals:   07/10/22 1936 07/10/22 2327 07/11/22 0324 07/11/22 0752  BP: (!) 140/83 (!) 145/92 127/82 126/78  Pulse: 83 83 84 88  Resp: 19 20 19 20   Temp: 98.1 F (36.7 C) 99 F (37.2 C) 99.2 F (37.3 C) 98.3 F (36.8 C)  TempSrc: Oral Oral Oral Oral  SpO2: 99% 99% 97% 98%  Weight:      Height:       Filed Weights   07/09/22  1107  Weight: 72.6 kg   Exam: General exam: Appears comfortable  HEENT: oral mucosa moist Respiratory system: Clear to auscultation.  Cardiovascular system: S1 & S2 heard  Gastrointestinal system: Abdomen soft, non-tender, nondistended. Normal bowel sounds   Extremities: No cyanosis, clubbing or edema Skin: maculopapular rash appears improved Psychiatry:  Mood & affect appropriate.       CBC: Recent Labs  Lab 07/09/22 1122 07/09/22 2053 07/10/22 1235  WBC 16.8* 17.8* 15.5*  NEUTROABS 14.4*  --   --   HGB 15.9 13.2 12.2*  HCT 44.3 37.7* 35.0*  MCV 87.9 89.5 90.9  PLT 396 339 290    Basic Metabolic Panel: Recent Labs  Lab 07/09/22 1248  07/09/22 2053 07/10/22 1235  NA 133*  --  132*  K 3.5  --  2.8*  CL 91*  --  97*  CO2 31  --  27  GLUCOSE 397*  --  214*  BUN 7  --  9  CREATININE 0.70 0.74 0.53*  CALCIUM 8.5*  --  7.9*    GFR: Estimated Creatinine Clearance: 117.2 mL/min (A) (by C-G formula based on SCr of 0.53 mg/dL (L)).  Scheduled Meds:  Chlorhexidine Gluconate Cloth  6 each Topical Daily   enoxaparin (LOVENOX) injection  40 mg Subcutaneous Q24H   feeding supplement (GLUCERNA SHAKE)  237 mL Oral TID BM    HYDROmorphone (DILAUDID) injection  0.5 mg Intravenous Once   insulin aspart  0-15 Units Subcutaneous TID WC   insulin glargine-yfgn  20 Units Subcutaneous Daily   multivitamin with minerals  1 tablet Oral Daily   nicotine  14 mg Transdermal Q24H   potassium chloride  40 mEq Oral Q4H   Continuous Infusions:  sodium chloride 100 mL/hr at 07/11/22 0815   cefTRIAXone (ROCEPHIN)  IV 2 g (07/10/22 1350)   potassium chloride Stopped (07/11/22 0921)   Imaging and lab data was personally reviewed CT ABDOMEN PELVIS W CONTRAST  Result Date: 07/09/2022 CLINICAL DATA:  Anterior inferior abdominal pain for evaluation of pyelonephritis EXAM: CT ABDOMEN AND PELVIS WITH CONTRAST TECHNIQUE: Multidetector CT imaging of the abdomen and pelvis was performed using the standard protocol following bolus administration of intravenous contrast. RADIATION DOSE REDUCTION: This exam was performed according to the departmental dose-optimization program which includes automated exposure control, adjustment of the mA and/or kV according to patient size and/or use of iterative reconstruction technique. CONTRAST:  OMNIPAQUE IOHEXOL 300 MG/ML  SOLN COMPARISON:  None Available. FINDINGS: Lower chest: Left lower lobe ground-glass nodule measures 9 x 7 mm (5:29). Additional ill-defined ground-glass density on series 5, image 18. Additional scattered solid nodules measuring up to 3 mm in the right (5:17) and left lower lobes (5:8,9).  Triangular right middle lobe perifissural nodule (5:8) is most often an intrapulmonary lymph node. No specific follow-up imaging recommended. No pleural effusion or pneumothorax demonstrated. Partially imaged heart size is normal. Hepatobiliary: Hypoattenuation along the falciform ligament may reflect focal steatosis or perfusional variation. No intra or extrahepatic biliary ductal dilation. Normal gallbladder. Pancreas: No focal lesions or main ductal dilation. Spleen: Enlarged in the AP dimension measures 14.9 cm. No focal lesions. Adrenals/Urinary Tract: Nodular thickening of the right adrenal gland without discrete nodule. Left adrenal nodule. Thick-walled hypoenhancing exophytic lesion along the anterolateral left interpolar kidney measures 1.4 x 1.0 cm (2:38). No hydronephrosis or stones. Circumferential mural thickening and pericystic stranding. Stomach/Bowel: Normal appearance of the stomach. No evidence of bowel wall thickening, distention, or inflammatory changes. Normal appendix. Vascular/Lymphatic: No  significant vascular findings are present. No enlarged abdominal or pelvic lymph nodes. Reproductive: Multiloculated rim enhancing fluid collections within the prostate gland measuring up to 5.7 x 3.3 cm in conglomerate (2:90). Other: No free fluid, fluid collection, or free air. Musculoskeletal: No acute or abnormal lytic or blastic osseous lesions. IMPRESSION: 1. Multiloculated rim enhancing fluid collections within the prostate gland measuring up to 5.7 x 3.3 cm in conglomerate, suspicious for abscesses. Cystic degeneration of BPH nodule and cystic neoplasm are considered much less likely in the setting of acute urinary tract infection. 2. Circumferential mural thickening and pericystic stranding, suggestive of cystitis. 3. Thick-walled hypoenhancing exophytic lesion along the anterolateral left interpolar kidney measures 1.4 x 1.0 cm, suspicious for hypoenhancing primary renal neoplasm. Recommend  further evaluation with nonemergent renal mass protocol MRI or CT. 4. Left lower lobe ground-glass nodule measures 9 x 7 mm, likely infectious/inflammatory. Additional scattered solid nodules measuring up to 3 mm in the bilateral lower lobes. Non-contrast chest CT at 3-6 months is recommended. If nodules persist, subsequent management will be based upon the most suspicious nodule(s). This recommendation follows the consensus statement: Guidelines for Management of Incidental Pulmonary Nodules Detected on CT Images: From the Fleischner Society 2017; Radiology 2017; 284:228-243. 5. Splenomegaly. Electronically Signed   By: Agustin Cree M.D.   On: 07/09/2022 16:34   DG Chest 2 View  Result Date: 07/09/2022 CLINICAL DATA:  Five day history of shortness of breath EXAM: CHEST - 2 VIEW COMPARISON:  Chest radiograph dated 06/28/2015 FINDINGS: Low lung volumes. No focal consolidations. No pleural effusion or pneumothorax. The heart size and mediastinal contours are within normal limits. No acute osseous abnormality. IMPRESSION: No active cardiopulmonary disease. Electronically Signed   By: Agustin Cree M.D.   On: 07/09/2022 12:10    LOS: 2 days   Author: Calvert Cantor  07/11/2022 10:41 AM  To contact [facility]>   Check the care team in [facility] and look for the attending/consulting [facility] provider listed  Log into [email or web address] and use [facility] universal password   Go to> "[facility]"  and find provider  If you still have difficulty reaching the provider, please page the [facility] (Director on Call) for the Hospitalists listed on amion

## 2022-07-12 ENCOUNTER — Inpatient Hospital Stay (HOSPITAL_COMMUNITY): Payer: Medicaid Other

## 2022-07-12 DIAGNOSIS — N412 Abscess of prostate: Secondary | ICD-10-CM

## 2022-07-12 LAB — GC/CHLAMYDIA PROBE AMP (~~LOC~~) NOT AT ARMC
Chlamydia: NEGATIVE
Comment: NEGATIVE
Comment: NORMAL
Neisseria Gonorrhea: NEGATIVE

## 2022-07-12 LAB — URINE CULTURE: Culture: 90000 — AB

## 2022-07-12 LAB — CBC
HCT: 34.8 % — ABNORMAL LOW (ref 39.0–52.0)
Hemoglobin: 11.8 g/dL — ABNORMAL LOW (ref 13.0–17.0)
MCH: 31.2 pg (ref 26.0–34.0)
MCHC: 33.9 g/dL (ref 30.0–36.0)
MCV: 92.1 fL (ref 80.0–100.0)
Platelets: 255 10*3/uL (ref 150–400)
RBC: 3.78 MIL/uL — ABNORMAL LOW (ref 4.22–5.81)
RDW: 12.7 % (ref 11.5–15.5)
WBC: 11.2 10*3/uL — ABNORMAL HIGH (ref 4.0–10.5)
nRBC: 0 % (ref 0.0–0.2)

## 2022-07-12 LAB — BASIC METABOLIC PANEL
Anion gap: 7 (ref 5–15)
BUN: 8 mg/dL (ref 6–20)
CO2: 26 mmol/L (ref 22–32)
Calcium: 7.9 mg/dL — ABNORMAL LOW (ref 8.9–10.3)
Chloride: 101 mmol/L (ref 98–111)
Creatinine, Ser: 0.48 mg/dL — ABNORMAL LOW (ref 0.61–1.24)
GFR, Estimated: >60 mL/min (ref >60)
Glucose, Bld: 203 mg/dL — ABNORMAL HIGH (ref 70–99)
Potassium: 4.1 mmol/L (ref 3.5–5.1)
Sodium: 134 mmol/L — ABNORMAL LOW (ref 135–145)

## 2022-07-12 LAB — GLUCOSE, CAPILLARY
Glucose-Capillary: 239 mg/dL — ABNORMAL HIGH (ref 70–99)
Glucose-Capillary: 234 mg/dL — ABNORMAL HIGH (ref 70–99)
Glucose-Capillary: 170 mg/dL — ABNORMAL HIGH (ref 70–99)
Glucose-Capillary: 233 mg/dL — ABNORMAL HIGH (ref 70–99)
Glucose-Capillary: 180 mg/dL — ABNORMAL HIGH (ref 70–99)
Glucose-Capillary: 265 mg/dL — ABNORMAL HIGH (ref 70–99)

## 2022-07-12 LAB — CULTURE, BLOOD (ROUTINE X 2)

## 2022-07-12 MED ORDER — ORAL CARE MOUTH RINSE: 15.0000 mL | OROMUCOSAL | Status: DC | PRN

## 2022-07-12 MED ORDER — IOHEXOL 300 MG/ML  SOLN
75.0000 mL | Freq: Once | INTRAMUSCULAR | Status: AC | PRN
  Administered 2022-07-12: 75 mL via INTRAVENOUS

## 2022-07-12 MED ORDER — POLYETHYLENE GLYCOL 3350 17 G PO PACK
17.0000 g | PACK | Freq: Every day | ORAL | Status: DC
  Administered 2022-07-12: 17 g via ORAL
  Filled 2022-07-12 (×2): qty 1

## 2022-07-12 NOTE — Plan of Care (Signed)
  Problem: Education: Goal: Knowledge of General Education information will improve Description: Including pain rating scale, medication(s)/side effects and non-pharmacologic comfort measures Outcome: Progressing   Problem: Clinical Measurements: Goal: Ability to maintain clinical measurements within normal limits will improve Outcome: Progressing Goal: Respiratory complications will improve Outcome: Progressing Goal: Cardiovascular complication will be avoided Outcome: Progressing   Problem: Coping: Goal: Level of anxiety will decrease Outcome: Progressing   Problem: Pain Managment: Goal: General experience of comfort will improve Outcome: Progressing   Problem: Health Behavior/Discharge Planning: Goal: Ability to manage health-related needs will improve Outcome: Adequate for Discharge   Problem: Activity: Goal: Risk for activity intolerance will decrease Outcome: Adequate for Discharge   Problem: Nutrition: Goal: Adequate nutrition will be maintained Outcome: Adequate for Discharge   Problem: Safety: Goal: Ability to remain free from injury will improve Outcome: Adequate for Discharge

## 2022-07-12 NOTE — Progress Notes (Signed)
2 Days Post-Op Subjective: S/p OR for transurethral unroofing of prostate.  NAEON.  Patient feels well today but is still having some expected soreness.  He has been struggling with constipation and has ambulated very little, which I have encouraged him to do.  Objective: Vital signs in last 24 hours: Temp:  [97.7 F (36.5 C)-98.6 F (37 C)] 98.2 F (36.8 C) (05/13 0557) Pulse Rate:  [85-87] 85 (05/13 0557) Resp:  [18-20] 18 (05/13 0557) BP: (141-161)/(92-97) 141/92 (05/13 0557) SpO2:  [98 %-100 %] 99 % (05/13 0557)  Intake/Output from previous day: 05/12 0701 - 05/13 0700 In: 2693.2 [P.O.:880; I.V.:1249.6; IV Piggyback:563.6] Out: 3650 [Urine:3650] Intake/Output this shift: Total I/O In: 240 [P.O.:240] Out: -   Physical Exam:  General: Alert and oriented CV: RRR Lungs: Clear Abdomen: Soft, ND GU: foley catheter draining cloudy yellow Ext: NT, No erythema  Lab Results: Recent Labs    07/09/22 2053 07/10/22 1235 07/12/22 0736  HGB 13.2 12.2* 11.8*  HCT 37.7* 35.0* 34.8*   BMET Recent Labs    07/10/22 1235 07/12/22 0736  NA 132* 134*  K 2.8* 4.1  CL 97* 101  CO2 27 26  GLUCOSE 214* 203*  BUN 9 8  CREATININE 0.53* 0.48*  CALCIUM 7.9* 7.9*     Studies/Results: No results found.  Assessment/Plan: #Prostate abscess - S/p OR for unroofing on 07/10/22 - Continue foley catheter to drainage through 07/15/22 - BCX NGTD; Ucx + MRSA - IV CTX + Vanc added today - Follow-up culture sensitivities, cater accordingly - Recommend 4 week course of antibiotic therapy    #Left renal lesion - suspicious for malignancy; will discuss further with patient while in-house and set-up for outpatient surveillance and discussions regarding management   #Hyperglycemia - Per primary team  #Constipation - Daily Miralax until one bowel movement per day - Up OOB if not sleeping - ambulate as tolerated.    LOS: 3 days   Scherrie Bateman Kersti Scavone 07/12/2022, 8:54 AM

## 2022-07-12 NOTE — Progress Notes (Signed)
[facility] Progress Note  Patient: Alejandro Santiago     WUJ:[id number]  DOA: 07/09/2022   PCP: Patient, No Pcp Per       Brief hospital course: Alejandro Santiago is a 47 y.o. male with no medical history who began to have pain in his "urethra" about 3 days ago. He has dysuria. No hematuria. He has had sweats and chills. Pain is currently in scrotum and anus and is 10/10 right now.  No discharge. He has one male partner over the last 12 years and this is his girlfriend. No h/o STDs.  He is also noted to have elevated glucose levels and is not aware that he is a diabetic.  As far as he knows, he does not have any medical problems and does not have a PCP    ED Course:  CT abdomen pelvis-multiloculated rim-enhancing fluid collections within the prostate gland measuring up to 5.7 x 3.3 cm - Circumferential mural thickening and pericystic stranding suggestive of cystitis - Thick-walled hypoenhancing exophytic lesion in the left kidney suspicious for primary renal neoplasm - Left lower lobe groundglass nodule with additional scattered solid nodules in bilateral lower lobes   Sodium 133, chloride 91 WBC 16.8 Hemoglobin A1c 9.9 UA reveals WBC, nitrites, ketones and glucose but no bacteria noted   Cocaine positive Also noted on exam are numerous maculopapular lesions which he states started about 2 weeks ago.  He states that he does not use any IV drugs.  Subjective:  Foley is causing the most discomfort right now. No other issues. Rash improving.   Assessment and Plan: Principal Problem:   Prostate abscesses- Sepsis POA - 5/11 I and D   - HIV non reactive - GC/ Chlamydia not collected by staff - U culture - MRSA-  blood cultures neg -5/13> Vanc started  Active Problems:  Extensive maculopapular and pustular rash - transient bacteremia as a result of these lesions? - Blood cultures neg -Interestingly the rash was not on his back but is on all other parts of the body and  therefore may be related to scratching - large pustular lesion on face- have not expressed this and it appears to be resolving    Diabetes mellitus type 2 in nonobese (HCC) -Not in DKA - A1c 9.9 - Cont insulin sliding scale, diabetic diet - Consulted diabetes coordinator and dietitian  Hyponatremia - He admits to poor oral intake + hyperglycemia over the past couple of days - cont cont IVF  Hypokalemia -  replaced-      Nicotine abuse -Smokes half a pack a day - Nicotine patch ordered   Cocaine abuse - Counseled - he does not admit to any other illicit drug use   Lung nodules - Will need to follow-up as outpatient- discussed with patinet        Code Status: Full Code Consultants: urology Level of Care: Level of care: Telemetry Total time on patient care: 35 min DVT prophylaxis:  enoxaparin (LOVENOX) injection 40 mg Start: 07/09/22 2200 Place and maintain sequential compression device Start: 07/09/22 1847     Objective:   Vitals:   07/11/22 0752 07/11/22 1229 07/11/22 2043 07/12/22 0557  BP: 126/78 (!) 155/94 (!) 161/97 (!) 141/92  Pulse: 88 87 86 85  Resp: 20 20 18 18   Temp: 98.3 F (36.8 C) 97.7 F (36.5 C) 98.6 F (37 C) 98.2 F (36.8 C)  TempSrc: Oral Oral Oral Oral  SpO2: 98% 100% 98% 99%  Weight:  Height:       Filed Weights   07/09/22 1107  Weight: 72.6 kg   Exam: General exam: Appears comfortable  HEENT: oral mucosa moist Respiratory system: Clear to auscultation.  Cardiovascular system: S1 & S2 heard  Gastrointestinal system: Abdomen soft, non-tender, nondistended. Normal bowel sounds   Skin: rash overall improved but still has some pustular lesions Extremities: No cyanosis, clubbing or edema Psychiatry:  Mood & affect appropriate.        CBC: Recent Labs  Lab 07/09/22 1122 07/09/22 2053 07/10/22 1235 07/12/22 0736  WBC 16.8* 17.8* 15.5* 11.2*  NEUTROABS 14.4*  --   --   --   HGB 15.9 13.2 12.2* 11.8*  HCT 44.3 37.7* 35.0*  34.8*  MCV 87.9 89.5 90.9 92.1  PLT 396 339 290 255    Basic Metabolic Panel: Recent Labs  Lab 07/09/22 1248 07/09/22 2053 07/10/22 1235 07/11/22 0927 07/12/22 0736  NA 133*  --  132*  --  134*  K 3.5  --  2.8*  --  4.1  CL 91*  --  97*  --  101  CO2 31  --  27  --  26  GLUCOSE 397*  --  214*  --  203*  BUN 7  --  9  --  8  CREATININE 0.70 0.74 0.53*  --  0.48*  CALCIUM 8.5*  --  7.9*  --  7.9*  MG  --   --   --  1.7  --     GFR: Estimated Creatinine Clearance: 117.2 mL/min (A) (by C-G formula based on SCr of 0.48 mg/dL (L)).  Scheduled Meds:  Chlorhexidine Gluconate Cloth  6 each Topical Daily   enoxaparin (LOVENOX) injection  40 mg Subcutaneous Q24H   feeding supplement (GLUCERNA SHAKE)  237 mL Oral TID BM    HYDROmorphone (DILAUDID) injection  0.5 mg Intravenous Once   insulin aspart  0-15 Units Subcutaneous TID WC   insulin glargine-yfgn  20 Units Subcutaneous Daily   multivitamin with minerals  1 tablet Oral Daily   nicotine  14 mg Transdermal Q24H   polyethylene glycol  17 g Oral Daily   Continuous Infusions:  sodium chloride 100 mL/hr at 07/11/22 2215   vancomycin 1,500 mg (07/12/22 0046)   Imaging and lab data was personally reviewed No results found.  LOS: 3 days   Author: Calvert Cantor  07/12/2022 10:21 AM  To contact [facility]>   Check the care team in [facility] and look for the attending/consulting [facility] provider listed  Log into [email or web address] and use [facility] universal password   Go to> "[facility]"  and find provider  If you still have difficulty reaching the provider, please page the [facility] (Director on Call) for the Hospitalists listed on amion

## 2022-07-12 NOTE — TOC Initial Note (Signed)
Transition of Care [facility]) - Initial/Assessment Note    Patient Details  Name: Alejandro Santiago MRN: [id number] Date of Birth: 07-27-1975  Transition of Care [facility]) CM/SW Contact:    Lanier Clam, RN Phone Number: 07/12/2022, 3:23 PM  Clinical Narrative: Provided w/PCP resource-can make own appt, Dept of social service resource provided. Continue to monitor for d/c needs.                  Expected Discharge Plan: Home/Self Care Barriers to Discharge: Continued Medical Work up   Patient Goals and CMS Choice            Expected Discharge Plan and Services                                              Prior Living Arrangements/Services                       Activities of Daily Living Home Assistive Devices/Equipment: None ADL Screening (condition at time of admission) Patient's cognitive ability adequate to safely complete daily activities?: Yes Is the patient deaf or have difficulty hearing?: No Does the patient have difficulty seeing, even when wearing glasses/contacts?: No Does the patient have difficulty concentrating, remembering, or making decisions?: No Patient able to express need for assistance with ADLs?: Yes Does the patient have difficulty dressing or bathing?: No Independently performs ADLs?: Yes (appropriate for developmental age) Does the patient have difficulty walking or climbing stairs?: No Weakness of Legs: None Weakness of Arms/Hands: None  Permission Sought/Granted                  Emotional Assessment              Admission diagnosis:  Prostate abscess [N41.2] Prostate infection [N41.9] Patient Active Problem List   Diagnosis Date Noted   Prostate abscess 07/09/2022   Diabetes mellitus type 2 in nonobese (HCC) 07/09/2022   PCP:  Patient, No Pcp Per Pharmacy:   [id number] - [place], [place] - [address] [place] [address] Phone:  [phone] Fax: [phone]     Social Determinants of Health (SDOH) Social History: SDOH Screenings   Food Insecurity: No Food Insecurity (07/09/2022)  Housing: Low Risk  (07/09/2022)  Transportation Needs: No Transportation Needs (07/09/2022)  Utilities: Not At Risk (07/09/2022)  Tobacco Use: High Risk (07/11/2022)   SDOH Interventions:     Readmission Risk Interventions     No data to display

## 2022-07-12 NOTE — Inpatient Diabetes Management (Signed)
Inpatient Diabetes Program Recommendations  AACE/ADA: New Consensus Statement on Inpatient Glycemic Control (2015)  Target Ranges:  Prepandial:   less than 140 mg/dL      Peak postprandial:   less than 180 mg/dL (1-2 hours)      Critically ill patients:  140 - 180 mg/dL   Lab Results  Component Value Date   GLUCAP 233 (H) 07/12/2022   HGBA1C 9.9 (H) 07/09/2022    Review of Glycemic Control  Diabetes history: New-onset DM Outpatient Diabetes medications: None Current orders for Inpatient glycemic control: Semglee 20 QD, Novolog 0-15 TID with meals  HgbA1C - 9.9% CBGs 170, 233 mg/dL  Inpatient Diabetes Program Recommendations:   Add Novolog 3 units TID with meals if eating > 50%  Briefly spoke with pt at bedside regarding new diagnosis of DM2. Pt states he was having a bad day, anxious about upcoming U/S, and it wasn't a good time to talk about his diabetes. 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   Spoke with RN and will f/u in am.   Thank you. Ailene Ards, RD, LDN, CDCES Inpatient Diabetes Coordinator [phone]

## 2022-07-12 NOTE — Consult Note (Signed)
[facility]                                                                                       Patient Identification: Patient Name: Alejandro Santiago MRN: [id number] Admit Date: 07/09/2022 11:01 AM Today's Date: 07/12/2022 Reason for consult: Prostatic abscess  Requesting provider: Dr Butler Denmark   Principal Problem:   Prostate abscess Active Problems:   Diabetes mellitus type 2 in nonobese [facility])   Antibiotics:  Vancomycin 5/12- Ceftriaxone 5/10-5/12 Ciprofloxacin 5/11 Metronidazole 5/10  Lines/Hardware: No known   Assessment 47 year old male with PMH of hypertension, cocaine abuse who presented with multiple complaints. Admitted with   # Prostatic abscess/cystitis  5/10 urine cx greater than 100,000 MRSA S/p TURP on 5/11. OR with purulence noted. Cx MRSA   # Multiple Pulmonary nodules r/o septic pulmonary emboli 2/2 MRSA  # Multiple pustular lesions in different parts of the body ( rt cheek, rt upper eyelid, rt lateral neck, rt lateral scalp, webspace between left thumb and index finger) - likely all MRSA related   #  Thick-walled hypoenhancing exophytic lesion along the anterolateral left interpolar kidney measures 1.4 x 1.0 cm, suspicious for hypoenhancing primary renal neoplasm - non emergent renal mass protocol MRI or CT  # Polysubstance use   Recommendations  Continue Vancomycin, pharmacy to dose  Will get CT chest w contrast due to concerns for possible pulmonary septic emboli 2/2 MRSA in the setting of drug use as well as TTE to r/o vegetations  Expect pustular lesions to improve with anti MRSA tx and will need monitoring  to see if needs I and D.  Will check for Hepatitis B and C serology  Monitor CBC, BMP and Vancomycin trough  Expect the  Following  Rest of the management as per the primary team. Please call with questions or concerns.  Thank you for the  consult  __________________________________________________________________________________________________________ HPI and Hospital Course: 47 year old male with PMH of hypertension, cocaine abuse who was brought from home by EMS on 5/10 with increased shortness of breath for 5 days as well as fatigue, nausea/vomiting abdominal pain, polydipsia/poluria, urinary retention, blister formation, difficulty with defecation  for similar duration.  Reported lost 23 pounds in the last 2-3 months. Was given 200 cc of IV fluid by EMS en route to ED.   Reports snorting cocaine but denies IVDU. Smokes cigarettes and alcohol occasionally. Sexually active with male. Lives with girlfriend. Willing to get screened for HIV and Hepatitis. Denies prior h/o MRSA infection or taking abtx prior to admit   At ED afebrile Labs remarkable for NA 133, BG 397, albumin 2.7, WBC 17.8, beta-hydroxybutyrate less than 0.05 5/10 UA with moderate leukocytes and positive nitrites, greater than 50 WBC 5/10 UDS cocaine  5/10 urine cx greater than 100,000 MRSA 5/10 blood cx NG in 2 days ( off anti MRSA tx)  5/10 Cytsoscopy cx MRSA Imagings as below  ROS: General- Denies fever, chills, loss of appetite, weight loss+ HEENT - Denies headache, blurry vision, neck pain, sinus pain Chest - Denies any chest pain, SOB or cough CVS- Denies any dizziness/lightheadedness, syncopal attacks, palpitations Abdomen- Denies any  hematochezia and diarrhea Neuro - Denies any weakness, numbness, tingling sensation Psych - Denies any changes in mood irritability or depressive symptoms GU- has a foley's  Skin - multiple bumps in different parts of the body  MSK - denies any joint pain/swelling or restricted ROM   Past Medical History:  Diagnosis Date   Hypertension    Past Surgical History:  Procedure Laterality Date   TRANSURETHRAL RESECTION OF PROSTATE N/A 07/10/2022   Procedure: TRANSURETHRAL RESECTION OF THE PROSTATE (TURP);  Surgeon:  Jerilee Field, MD;  Location: [facility];  Service: Urology;  Laterality: N/A;     Scheduled Meds:  Chlorhexidine Gluconate Cloth  6 each Topical Daily   enoxaparin (LOVENOX) injection  40 mg Subcutaneous Q24H   feeding supplement (GLUCERNA SHAKE)  237 mL Oral TID BM    HYDROmorphone (DILAUDID) injection  0.5 mg Intravenous Once   insulin aspart  0-15 Units Subcutaneous TID WC   insulin glargine-yfgn  20 Units Subcutaneous Daily   multivitamin with minerals  1 tablet Oral Daily   nicotine  14 mg Transdermal Q24H   polyethylene glycol  17 g Oral Daily   Continuous Infusions:  sodium chloride 100 mL/hr at 07/11/22 2215   vancomycin 1,500 mg (07/12/22 0046)   PRN Meds:.acetaminophen **OR** acetaminophen, bisacodyl, HYDROcodone-acetaminophen, ondansetron **OR** ondansetron (ZOFRAN) IV  No Known Allergies  Social History   Socioeconomic History   Marital status: Single    Spouse name: Not on file   Number of children: Not on file   Years of education: Not on file   Highest education level: Not on file  Occupational History   Not on file  Tobacco Use   Smoking status: Every Day    Packs/day: .5    Types: Cigarettes   Smokeless tobacco: Never  Substance and Sexual Activity   Alcohol use: Yes    Comment: occasionally   Drug use: No   Sexual activity: Not on file  Other Topics Concern   Not on file  Social History Narrative   Not on file   Social Determinants of Health   Financial Resource Strain: Not on file  Food Insecurity: No Food Insecurity (07/09/2022)   Hunger Vital Sign    Worried About Running Out of Food in the Last Year: Never true    Ran Out of Food in the Last Year: Never true  Transportation Needs: No Transportation Needs (07/09/2022)   PRAPARE - Administrator, Civil Service (Medical): No    Lack of Transportation (Non-Medical): No  Physical Activity: Not on file  Stress: Not on file  Social Connections: Not on file  Intimate Partner  Violence: Not At Risk (07/09/2022)   Humiliation, Afraid, Rape, and Kick questionnaire    Fear of Current or Ex-Partner: No    Emotionally Abused: No    Physically Abused: No    Sexually Abused: No   History reviewed. No pertinent family history.  Vitals BP (!) 141/92 (BP Location: Left Arm)   Pulse 85   Temp 98.2 F (36.8 C) (Oral)   Resp 18   Ht 6' (1.829 m)   Wt 72.6 kg   SpO2 99%   BMI 21.70 kg/m    Physical Exam Constitutional:  adult male lying in the bed and appears comfortable     Comments:   Cardiovascular:     Rate and Rhythm: Normal rate and regular rhythm.     Heart sounds: s1 and s2  Pulmonary:     Effort:  Pulmonary effort is normal on room air     Comments: Normal breath sounds   Abdominal:     Palpations: Abdomen is soft.     Tenderness: non distended and non tender   Musculoskeletal:        General: No swelling or tenderness in peripheral joints   Skin:    Comments: bump in the rt cheek, rt lateral neck as well as rt scalp which is somewhat indurated but non fluctuant/drainage or open wounds healing wound in the web space between left index finger and thumb, non fluctuant or drainage or active signs of infection. ROM of left index finger and thumb intact   Neurological:     General: awake, alert and oriented, grossly non focal, follows commands.   Psychiatric:        Mood and Affect: Mood normal.    Pertinent Microbiology Results for orders placed or performed during the hospital encounter of 07/09/22  Urine Culture     Status: Abnormal   Collection Time: 07/09/22 12:38 PM   Specimen: Urine, Clean Catch  Result Value Ref Range Status   Specimen Description   Final    URINE, CLEAN CATCH Performed at [facility], [address] [place], [place] [address]    Special Requests   Final    NONE Performed at [facility], [address], [place], [place] [address]    Culture (A)  Final    >=100,000  COLONIES/mL METHICILLIN RESISTANT STAPHYLOCOCCUS AUREUS   Report Status 07/11/2022 FINAL  Final   Organism ID, Bacteria METHICILLIN RESISTANT STAPHYLOCOCCUS AUREUS (A)  Final      Susceptibility   Methicillin resistant staphylococcus aureus - MIC*    CIPROFLOXACIN >=8 RESISTANT Resistant     GENTAMICIN <=0.5 SENSITIVE Sensitive     NITROFURANTOIN <=16 SENSITIVE Sensitive     OXACILLIN >=4 RESISTANT Resistant     TETRACYCLINE <=1 SENSITIVE Sensitive     VANCOMYCIN <=0.5 SENSITIVE Sensitive     TRIMETH/SULFA >=320 RESISTANT Resistant     CLINDAMYCIN <=0.25 SENSITIVE Sensitive     RIFAMPIN <=0.5 SENSITIVE Sensitive     Inducible Clindamycin NEGATIVE Sensitive     LINEZOLID 2 SENSITIVE Sensitive     * >=100,000 COLONIES/mL METHICILLIN RESISTANT STAPHYLOCOCCUS AUREUS  Blood culture (routine x 2)     Status: None (Preliminary result)   Collection Time: 07/09/22  8:53 PM   Specimen: BLOOD  Result Value Ref Range Status   Specimen Description   Final    BLOOD BLOOD RIGHT ARM Performed at [facility], [address], [place], [place] [address]    Special Requests   Final    BOTTLES DRAWN AEROBIC ONLY Blood Culture adequate volume Performed at [facility], [address] [place], [place] [address]    Culture   Final    NO GROWTH 2 DAYS Performed at [facility], [address] [place], [place] [address]    Report Status PENDING  Incomplete  Blood culture (routine x 2)     Status: None (Preliminary result)   Collection Time: 07/09/22  8:58 PM   Specimen: BLOOD  Result Value Ref Range Status   Specimen Description   Final    BLOOD BLOOD LEFT ARM Performed at [facility], [address] [place], [place] [address]    Special Requests   Final    BOTTLES DRAWN AEROBIC ONLY Blood Culture adequate volume Performed at [facility],  [address], [place], [place] [address]    Culture   Final    NO  GROWTH 2 DAYS Performed at [facility], [address] [place], [place] [address]    Report Status PENDING  Incomplete  Urine Culture     Status: Abnormal   Collection Time: 07/10/22 10:01 AM   Specimen: Urine, Cystoscope  Result Value Ref Range Status   Specimen Description   Final    CYSTOSCOPY Performed at [facility], [address] [place], [place] [address]    Special Requests   Final    NONE Performed at [facility], [address] [place], [place] [address]    Culture (A)  Final    90,000 COLONIES/mL METHICILLIN RESISTANT STAPHYLOCOCCUS AUREUS   Report Status 07/12/2022 FINAL  Final   Organism ID, Bacteria METHICILLIN RESISTANT STAPHYLOCOCCUS AUREUS (A)  Final      Susceptibility   Methicillin resistant staphylococcus aureus - MIC*    CIPROFLOXACIN >=8 RESISTANT Resistant     ERYTHROMYCIN <=0.25 SENSITIVE Sensitive     GENTAMICIN <=0.5 SENSITIVE Sensitive     OXACILLIN >=4 RESISTANT Resistant     TETRACYCLINE <=1 SENSITIVE Sensitive     VANCOMYCIN <=0.5 SENSITIVE Sensitive     TRIMETH/SULFA >=320 RESISTANT Resistant     CLINDAMYCIN <=0.25 SENSITIVE Sensitive     RIFAMPIN <=0.5 SENSITIVE Sensitive     Inducible Clindamycin NEGATIVE Sensitive     LINEZOLID 2 SENSITIVE Sensitive     * 90,000 COLONIES/mL METHICILLIN RESISTANT STAPHYLOCOCCUS AUREUS   Pertinent Lab seen by me:    Latest Ref Rng & Units 07/12/2022    7:36 AM 07/10/2022   12:35 PM 07/09/2022    8:53 PM  CBC  WBC 4.0 - 10.5 K/uL 11.2  15.5  17.8   Hemoglobin 13.0 - 17.0 g/dL 29.5  62.1  30.8   Hematocrit 39.0 - 52.0 % 34.8  35.0  37.7   Platelets 150 - 400 K/uL 255  290  339       Latest Ref Rng & Units 07/12/2022    7:36 AM 07/10/2022   12:35 PM 07/09/2022    8:53 PM  CMP  Glucose 70 - 99 mg/dL 657  846    BUN 6 - 20 mg/dL 8  9    Creatinine 9.62 - 1.24 mg/dL 9.52  8.41  3.24   Sodium 135 - 145 mmol/L 134  132    Potassium 3.5 - 5.1 mmol/L 4.1  2.8     Chloride 98 - 111 mmol/L 101  97    CO2 22 - 32 mmol/L 26  27    Calcium 8.9 - 10.3 mg/dL 7.9  7.9       Pertinent Imagings/Other Imagings Plain films and CT images have been personally visualized and interpreted; radiology reports have been reviewed. Decision making incorporated into the Impression / Recommendations.  CT ABDOMEN PELVIS W CONTRAST  Result Date: 07/09/2022 CLINICAL DATA:  Anterior inferior abdominal pain for evaluation of pyelonephritis EXAM: CT ABDOMEN AND PELVIS WITH CONTRAST TECHNIQUE: Multidetector CT imaging of the abdomen and pelvis was performed using the standard protocol following bolus administration of intravenous contrast. RADIATION DOSE REDUCTION: This exam was performed according to the departmental dose-optimization program which includes automated exposure control, adjustment of the mA and/or kV according to patient size and/or use of iterative reconstruction technique. CONTRAST:  OMNIPAQUE IOHEXOL 300 MG/ML  SOLN COMPARISON:  None Available. FINDINGS: Lower chest: Left lower  lobe ground-glass nodule measures 9 x 7 mm (5:29). Additional ill-defined ground-glass density on series 5, image 18. Additional scattered solid nodules measuring up to 3 mm in the right (5:17) and left lower lobes (5:8,9). Triangular right middle lobe perifissural nodule (5:8) is most often an intrapulmonary lymph node. No specific follow-up imaging recommended. No pleural effusion or pneumothorax demonstrated. Partially imaged heart size is normal. Hepatobiliary: Hypoattenuation along the falciform ligament may reflect focal steatosis or perfusional variation. No intra or extrahepatic biliary ductal dilation. Normal gallbladder. Pancreas: No focal lesions or main ductal dilation. Spleen: Enlarged in the AP dimension measures 14.9 cm. No focal lesions. Adrenals/Urinary Tract: Nodular thickening of the right adrenal gland without discrete nodule. Left adrenal nodule. Thick-walled hypoenhancing  exophytic lesion along the anterolateral left interpolar kidney measures 1.4 x 1.0 cm (2:38). No hydronephrosis or stones. Circumferential mural thickening and pericystic stranding. Stomach/Bowel: Normal appearance of the stomach. No evidence of bowel wall thickening, distention, or inflammatory changes. Normal appendix. Vascular/Lymphatic: No significant vascular findings are present. No enlarged abdominal or pelvic lymph nodes. Reproductive: Multiloculated rim enhancing fluid collections within the prostate gland measuring up to 5.7 x 3.3 cm in conglomerate (2:90). Other: No free fluid, fluid collection, or free air. Musculoskeletal: No acute or abnormal lytic or blastic osseous lesions. IMPRESSION: 1. Multiloculated rim enhancing fluid collections within the prostate gland measuring up to 5.7 x 3.3 cm in conglomerate, suspicious for abscesses. Cystic degeneration of BPH nodule and cystic neoplasm are considered much less likely in the setting of acute urinary tract infection. 2. Circumferential mural thickening and pericystic stranding, suggestive of cystitis. 3. Thick-walled hypoenhancing exophytic lesion along the anterolateral left interpolar kidney measures 1.4 x 1.0 cm, suspicious for hypoenhancing primary renal neoplasm. Recommend further evaluation with nonemergent renal mass protocol MRI or CT. 4. Left lower lobe ground-glass nodule measures 9 x 7 mm, likely infectious/inflammatory. Additional scattered solid nodules measuring up to 3 mm in the bilateral lower lobes. Non-contrast chest CT at 3-6 months is recommended. If nodules persist, subsequent management will be based upon the most suspicious nodule(s). This recommendation follows the consensus statement: Guidelines for Management of Incidental Pulmonary Nodules Detected on CT Images: From the Fleischner Society 2017; Radiology 2017; 284:228-243. 5. Splenomegaly. Electronically Signed   By: Agustin Cree M.D.   On: 07/09/2022 16:34   DG Chest 2  View  Result Date: 07/09/2022 CLINICAL DATA:  Five day history of shortness of breath EXAM: CHEST - 2 VIEW COMPARISON:  Chest radiograph dated 06/28/2015 FINDINGS: Low lung volumes. No focal consolidations. No pleural effusion or pneumothorax. The heart size and mediastinal contours are within normal limits. No acute osseous abnormality. IMPRESSION: No active cardiopulmonary disease. Electronically Signed   By: Agustin Cree M.D.   On: 07/09/2022 12:10    I have personally spent 90 minutes involved in face-to-face and non-face-to-face activities for this patient on the day of the visit. Professional time spent includes the following activities: Preparing to see the patient (review of tests), Obtaining and/or reviewing separately obtained history (admission/discharge record), Performing a medically appropriate examination and/or evaluation , Ordering medications/tests/procedures, referring and communicating with other health care professionals, Documenting clinical information in the EMR, Independently interpreting results (not separately reported), Communicating results to the patient/family/caregiver, Counseling and educating the patient/family/caregiver and Care coordination (not separately reported).  Electronically signed by:   Plan d/w requesting provider as well as ID pharm D  Note: This document was prepared using dragon voice recognition software and may include unintentional dictation  errors.   Odette Fraction, MD Infectious Disease Physician [facility] Pager: [phone]

## 2022-07-13 ENCOUNTER — Inpatient Hospital Stay (HOSPITAL_COMMUNITY): Payer: Medicaid Other

## 2022-07-13 DIAGNOSIS — N412 Abscess of prostate: Secondary | ICD-10-CM

## 2022-07-13 LAB — GLUCOSE, CAPILLARY
Glucose-Capillary: 318 mg/dL — ABNORMAL HIGH (ref 70–99)
Glucose-Capillary: 213 mg/dL — ABNORMAL HIGH (ref 70–99)
Glucose-Capillary: 273 mg/dL — ABNORMAL HIGH (ref 70–99)
Glucose-Capillary: 240 mg/dL — ABNORMAL HIGH (ref 70–99)
Glucose-Capillary: 140 mg/dL — ABNORMAL HIGH (ref 70–99)

## 2022-07-13 LAB — ECHOCARDIOGRAM COMPLETE
AR max vel: 2.54 cm2
AV Area VTI: 2.65 cm2
AV Area mean vel: 2.51 cm2
AV Mean grad: 5 mmHg
AV Peak grad: 9.1 mmHg
Ao pk vel: 1.51 m/s
Area-P 1/2: 3.08 cm2
Calc EF: 67.4 %
Height: 72 in
MV VTI: 2.68 cm2
S' Lateral: 3.1 cm
Single Plane A2C EF: 66.5 %
Single Plane A4C EF: 67.2 %
Weight: 2560 oz

## 2022-07-13 LAB — HEPATITIS B CORE ANTIBODY, TOTAL: Hep B Core Total Ab: NONREACTIVE

## 2022-07-13 LAB — HEPATITIS B SURFACE ANTIGEN: Hepatitis B Surface Ag: NONREACTIVE

## 2022-07-13 LAB — HEPATITIS C ANTIBODY: HCV Ab: NONREACTIVE

## 2022-07-13 MED ORDER — HYDROCODONE-ACETAMINOPHEN 7.5-325 MG PO TABS
1.0000 | ORAL_TABLET | Freq: Every day | ORAL | Status: DC
  Administered 2022-07-13 – 2022-07-18 (×6): 1 via ORAL
  Filled 2022-07-13 (×7): qty 1

## 2022-07-13 MED ORDER — INSULIN STARTER KIT- PEN NEEDLES (ENGLISH)
1.0000 | Freq: Once | Status: DC
  Filled 2022-07-13: qty 1

## 2022-07-13 MED ORDER — NYSTATIN-TRIAMCINOLONE 100000-0.1 UNIT/GM-% EX CREA
TOPICAL_CREAM | Freq: Two times a day (BID) | CUTANEOUS | Status: DC
  Filled 2022-07-13: qty 30

## 2022-07-13 MED ORDER — INSULIN ASPART 100 UNIT/ML IJ SOLN
3.0000 [IU] | Freq: Three times a day (TID) | INTRAMUSCULAR | Status: DC
  Administered 2022-07-13 – 2022-07-14 (×5): 3 [IU] via SUBCUTANEOUS

## 2022-07-13 MED ORDER — LIVING WELL WITH DIABETES BOOK
Freq: Once | Status: AC
  Filled 2022-07-13: qty 1

## 2022-07-13 MED ORDER — KETOCONAZOLE 2 % EX CREA
TOPICAL_CREAM | Freq: Every day | CUTANEOUS | Status: DC
  Filled 2022-07-13: qty 15

## 2022-07-13 MED ORDER — POLYETHYLENE GLYCOL 3350 17 G PO PACK
17.0000 g | PACK | Freq: Two times a day (BID) | ORAL | Status: DC
  Administered 2022-07-13 – 2022-07-18 (×10): 17 g via ORAL
  Filled 2022-07-13 (×11): qty 1

## 2022-07-13 NOTE — Inpatient Diabetes Management (Signed)
Inpatient Diabetes Program Recommendations  AACE/ADA: New Consensus Statement on Inpatient Glycemic Control (2015)  Target Ranges:  Prepandial:   less than 140 mg/dL      Peak postprandial:   less than 180 mg/dL (1-2 hours)      Critically ill patients:  140 - 180 mg/dL   Lab Results  Component Value Date   GLUCAP 273 (H) 07/13/2022   HGBA1C 9.9 (H) 07/09/2022    Review of Glycemic Control  Diabetes history: None - new onset DM2 Outpatient Diabetes medications: None Current orders for Inpatient glycemic control: Semglee 20 QD, Novolog 0-15 TID with meals + 3 units TID  HgbA1C - 9.9% CBGs : 318, 213, 273 mg/dL  Inpatient Diabetes Program Recommendations:    Consider increasing Semglee to 23 units QD  If post-prandials continue > 180 mg/dL, increase Novolog to 5 units TID  Spoke with pt at bedside regarding new diagnosis. Discussed A1C results (9.9%) and explained what an A1C is and informed patient that his current A1C indicates an average glucose of 240 mg/dl over the past 2-3 months.  Discussed basic pathophysiology of DM Type 2, basic home care, importance of checking CBGs and maintaining good CBG control to prevent long-term and short-term complications. Reviewed glucose and A1C goals. Reviewed signs and symptoms of hyperglycemia and hypoglycemia along with treatment for both. Discussed impact of nutrition, exercise, stress, sickness, and medications on diabetes control. Ordered Living Well with diabetes booklet and encouraged patient to read through entire book. Also discussed glucose monitoring at home. Instructed to check blood sugars 3-4x/day. Explained how the doctor he follows up with can use the log book to continue to make insulin adjustments if needed. Demonstrated insulin pen use. Ordered starter kit and will teach insulin pen administration on 5/15. Pt will also need PCP to manage his diabetes. Will order [facility] consult for same.  Patient verbalized understanding of  information discussed and he states that he has no further questions at this time related to diabetes.   RNs to provide ongoing basic DM education at bedside with this patient and engage patient to actively check blood glucose and administer insulin injections.   Continue to follow.  Thank you. Ailene Ards, RD, LDN, CDCES Inpatient Diabetes Coordinator [phone]

## 2022-07-13 NOTE — Progress Notes (Addendum)
[facility] Progress Note  Patient: Alejandro Santiago     ZOX:[id number]  DOA: 07/09/2022   PCP: Patient, No Pcp Per       Brief hospital course: Alejandro Santiago is a 47 y.o. male with no medical history who began to have pain in his "urethra" about 3 days ago. He has dysuria. No hematuria. He has had sweats and chills. Pain is currently in scrotum and anus and is 10/10 right now.  No discharge. He has one male partner over the last 12 years and this is his girlfriend. No h/o STDs.  He is also noted to have elevated glucose levels and is not aware that he is a diabetic.  As far as he knows, he does not have any medical problems and does not have a PCP    ED Course:  CT abdomen pelvis-multiloculated rim-enhancing fluid collections within the prostate gland measuring up to 5.7 x 3.3 cm - Circumferential mural thickening and pericystic stranding suggestive of cystitis - Thick-walled hypoenhancing exophytic lesion in the left kidney suspicious for primary renal neoplasm - Left lower lobe groundglass nodule with additional scattered solid nodules in bilateral lower lobes   Sodium 133, chloride 91 WBC 16.8 Hemoglobin A1c 9.9 UA reveals WBC, nitrites, ketones and glucose but no bacteria noted   Cocaine positive Also noted on exam are numerous maculopapular lesions which he states started about 2 weeks ago.  He states that he does not use any IV drugs.  Subjective:  Foley uncomfortable. No other complaints.    Assessment and Plan: Principal Problem:   Prostate abscesses- Sepsis POA - 5/11 I and D  - has foley - HIV non reactive - GC/ Chlamydia not collected by staff - U culture - MRSA-  blood cultures neg but still suspect secondary to bacteremia -5/13> Vanc started  Active Problems:  Extensive maculopapular and pustular rash, septic emboli to lung - transient bacteremia as a result of these lesions? - rash improving -Interestingly the rash was not on his back but is on  all other parts of the body and therefore may have be related to scratching - large pustular lesion on face has improved - osler node on right index finger - TTE done and pending- Blood cultures neg - appreciate ID    Diabetes mellitus type 2 in nonobese (HCC) -Not in DKA - A1c 9.9 - Cont insulin sliding scale, diabetic diet - I discussed diet control an consulted diabetes coordinator and dietitian  Rash in groin - appears fungal but not candida- try ketoconazole  Hyponatremia - He admits to poor oral intake + hyperglycemia over the past couple of days - cont cont IVF   Hypokalemia -  replaced-      Nicotine abuse -Smokes half a pack a day - Nicotine patch ordered   Cocaine abuse - Counseled - he does not admit to any other illicit drug use specifically IV drugs   Lung nodules and left renal lesion - Will need to follow-up - discussed with patinet        Code Status: Full Code Consultants: urology, ID Level of Care: Level of care: Telemetry Total time on patient care: 35 min DVT prophylaxis:  enoxaparin (LOVENOX) injection 40 mg Start: 07/09/22 2200 Place and maintain sequential compression device Start: 07/09/22 1847     Objective:   Vitals:   07/12/22 0557 07/12/22 1234 07/13/22 0545 07/13/22 1152  BP: (!) 141/92 (!) 148/99 134/83 (!) 157/97  Pulse: 85 83 71 81  Resp: 18  17 20 18   Temp: 98.2 F (36.8 C) 98 F (36.7 C) 97.7 F (36.5 C) 98.9 F (37.2 C)  TempSrc: Oral Oral Oral Oral  SpO2: 99% 100% 98% 99%  Weight:      Height:       Filed Weights   07/09/22 1107  Weight: 72.6 kg   Exam: General exam: Appears comfortable  HEENT: oral mucosa moist Respiratory system: Clear to auscultation.  Cardiovascular system: S1 & S2 heard  Gastrointestinal system: Abdomen soft, non-tender, nondistended. Normal bowel sounds   Extremities: No cyanosis, clubbing or edema- right fingertip red and tender- no pustule noted Groin: mildly erythematous scaly rash on  groin and thighs- R > L Psychiatry:  Mood & affect appropriate.         CBC: Recent Labs  Lab 07/09/22 1122 07/09/22 2053 07/10/22 1235 07/12/22 0736  WBC 16.8* 17.8* 15.5* 11.2*  NEUTROABS 14.4*  --   --   --   HGB 15.9 13.2 12.2* 11.8*  HCT 44.3 37.7* 35.0* 34.8*  MCV 87.9 89.5 90.9 92.1  PLT 396 339 290 255   Basic Metabolic Panel: Recent Labs  Lab 07/09/22 1248 07/09/22 2053 07/10/22 1235 07/11/22 0927 07/12/22 0736  NA 133*  --  132*  --  134*  K 3.5  --  2.8*  --  4.1  CL 91*  --  97*  --  101  CO2 31  --  27  --  26  GLUCOSE 397*  --  214*  --  203*  BUN 7  --  9  --  8  CREATININE 0.70 0.74 0.53*  --  0.48*  CALCIUM 8.5*  --  7.9*  --  7.9*  MG  --   --   --  1.7  --    GFR: Estimated Creatinine Clearance: 117.2 mL/min (A) (by C-G formula based on SCr of 0.48 mg/dL (L)).  Scheduled Meds:  Chlorhexidine Gluconate Cloth  6 each Topical Daily   enoxaparin (LOVENOX) injection  40 mg Subcutaneous Q24H   feeding supplement (GLUCERNA SHAKE)  237 mL Oral TID BM   HYDROcodone-acetaminophen  1 tablet Oral QHS    HYDROmorphone (DILAUDID) injection  0.5 mg Intravenous Once   insulin aspart  0-15 Units Subcutaneous TID WC   insulin aspart  3 Units Subcutaneous TID WC   insulin glargine-yfgn  20 Units Subcutaneous Daily   ketoconazole   Topical Daily   multivitamin with minerals  1 tablet Oral Daily   nicotine  14 mg Transdermal Q24H   nystatin-triamcinolone   Topical BID   polyethylene glycol  17 g Oral BID   Continuous Infusions:  sodium chloride 100 mL/hr at 07/12/22 1032   vancomycin 1,500 mg (07/13/22 1049)   Imaging and lab data was personally reviewed CT CHEST W CONTRAST  Result Date: 07/12/2022 CLINICAL DATA:  Evaluation of septic pulmonary emboli in the setting of MRSA prostatic abscesses EXAM: CT CHEST WITH CONTRAST TECHNIQUE: Multidetector CT imaging of the chest was performed during intravenous contrast administration. RADIATION DOSE REDUCTION:  This exam was performed according to the departmental dose-optimization program which includes automated exposure control, adjustment of the mA and/or kV according to patient size and/or use of iterative reconstruction technique. CONTRAST:  75mL OMNIPAQUE IOHEXOL 300 MG/ML  SOLN COMPARISON:  Chest radiograph dated 07/09/2022 FINDINGS: Cardiovascular: Normal heart size. No significant pericardial fluid/thickening. Great vessels are normal in course and caliber. No central pulmonary emboli. Coronary artery calcifications. Mediastinum/Nodes: Imaged thyroid gland without nodules  meeting criteria for imaging follow-up by size. Normal esophagus. No pathologically enlarged axillary, supraclavicular, mediastinal, or hilar lymph nodes. Lungs/Pleura: The central airways are patent. Diffuse right upper lobe tree-in-bud nodules and dependent left lower lobe with additional scattered nodules measuring up to 6 x 4 mm in the right lower lobe (6:92) 1.8 x 0.7 cm in the left lower lobe (6:102). 5 x 5 mm perifissural right middle lobe nodule (6: 86) is most often an intrapulmonary lymph node. Lateral interlobular septal thickening. No pneumothorax. Trace bilateral pleural effusions. Upper abdomen: Similar splenomegaly measures 15.2 cm. Musculoskeletal: No acute or abnormal lytic or blastic osseous lesions. IMPRESSION: 1. Diffuse right upper lobe tree-in-bud nodules and scattered nodularity, likely infectious/inflammatory. Dependent left lower lobe nodules measuring up to 1.8 x 0.7 cm are in a distribution typical of septic pulmonary emboli without current necrosis. Non-contrast chest CT at 3-6 months is recommended. If the nodules are stable at time of repeat CT, then future CT at 18-24 months (from today's scan) is considered optional for low-risk patients, but is recommended for high-risk patients. This recommendation follows the consensus statement: Guidelines for Management of Incidental Pulmonary Nodules Detected on CT Images:  From the Fleischner Society 2017; Radiology 2017; 284:228-243. 2. Trace bilateral pleural effusions. 3. Similar splenomegaly measures 15.2 cm. 4. Coronary artery calcifications. Assessment for potential risk factor modification, dietary therapy or pharmacologic therapy may be warranted, if clinically indicated. Electronically Signed   By: Agustin Cree M.D.   On: 07/12/2022 15:55    LOS: 4 days   Author: Calvert Cantor  07/13/2022 11:55 AM  To contact [facility]>   Check the care team in [facility] and look for the attending/consulting [facility] provider listed  Log into [email or web address] and use 's universal password   Go to> "[facility]"  and find provider  If you still have difficulty reaching the provider, please page the [facility] (Director on Call) for the Hospitalists listed on amion

## 2022-07-13 NOTE — Progress Notes (Signed)
Nutrition Note  RD followed up to provide diabetes diet education. Initial note placed on 5/11.  Lab Results  Component Value Date   HGBA1C 9.9 (H) 07/09/2022    RD provided "Carbohydrate Counting for People with Diabetes" handout from the Academy of Nutrition and Dietetics. Discussed different food groups and their effects on blood sugar, emphasizing carbohydrate-containing foods. Provided list of carbohydrates and recommended serving sizes of common foods.  Discussed importance of controlled and consistent carbohydrate intake throughout the day. Provided examples of ways to balance meals/snacks and encouraged intake of high-fiber, whole grain complex carbohydrates. Teach back method used.  Expect fair-good compliance. Pt reports he understands diet. Did not have many questions for RD.  Body mass index is 21.7 kg/m. Pt meets criteria for normal based on current BMI.  Current diet order is CHO modified, patient is consuming approximately 100% of meals at this time. Labs and medications reviewed. If additional nutrition issues arise, please re-consult RD.  Tilda Franco, MS, RD, LDN Inpatient Clinical Dietitian Contact information available via Amion

## 2022-07-14 DIAGNOSIS — N419 Inflammatory disease of prostate, unspecified: Secondary | ICD-10-CM

## 2022-07-14 LAB — GLUCOSE, CAPILLARY
Glucose-Capillary: 324 mg/dL — ABNORMAL HIGH (ref 70–99)
Glucose-Capillary: 251 mg/dL — ABNORMAL HIGH (ref 70–99)
Glucose-Capillary: 238 mg/dL — ABNORMAL HIGH (ref 70–99)
Glucose-Capillary: 339 mg/dL — ABNORMAL HIGH (ref 70–99)
Glucose-Capillary: 282 mg/dL — ABNORMAL HIGH (ref 70–99)

## 2022-07-14 LAB — HEPATITIS B SURFACE ANTIBODY, QUANTITATIVE: Hep B S AB Quant (Post): <3.5 m[IU]/mL — ABNORMAL LOW (ref >9.9)

## 2022-07-14 LAB — BASIC METABOLIC PANEL
Anion gap: 7 (ref 5–15)
BUN: 11 mg/dL (ref 6–20)
CO2: 29 mmol/L (ref 22–32)
Calcium: 8.3 mg/dL — ABNORMAL LOW (ref 8.9–10.3)
Chloride: 93 mmol/L — ABNORMAL LOW (ref 98–111)
Creatinine, Ser: 0.72 mg/dL (ref 0.61–1.24)
GFR, Estimated: >60 mL/min (ref >60)
Glucose, Bld: 330 mg/dL — ABNORMAL HIGH (ref 70–99)
Potassium: 4.6 mmol/L (ref 3.5–5.1)
Sodium: 129 mmol/L — ABNORMAL LOW (ref 135–145)

## 2022-07-14 MED ORDER — INSULIN GLARGINE-YFGN 100 UNIT/ML ~~LOC~~ SOLN
23.0000 [IU] | Freq: Every day | SUBCUTANEOUS | Status: DC
  Administered 2022-07-14: 23 [IU] via SUBCUTANEOUS
  Filled 2022-07-14 (×2): qty 0.23

## 2022-07-14 NOTE — Progress Notes (Addendum)
4 Days Post-Op Subjective: S/p OR for transurethral unroofing of prostate.  NAEON.  It appears that his MRSA infection is widespread with probable septic emboli in the lungs.  I inquired as to any present or past IV drug use.  He is adamant that he has never done anything of the sort.  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   Objective: Vital signs in last 24 hours: Temp:  [97.9 F (36.6 C)-98.9 F (37.2 C)] 98 F (36.7 C) (05/15 0601) Pulse Rate:  [73-81] 73 (05/15 0601) Resp:  [18-20] 20 (05/15 0601) BP: (131-164)/(79-97) 131/79 (05/15 0601) SpO2:  [99 %-100 %] 100 % (05/15 0601)  Intake/Output from previous day: 05/14 0701 - 05/15 0700 In: 960 [P.O.:960] Out: 6100 [Urine:6100] Intake/Output this shift: Total I/O In: -  Out: 600 [Urine:600]  Physical Exam:  General: Alert and oriented CV: RRR Lungs: Clear Abdomen: Soft, ND GU: foley catheter draining clear yellow urine Ext: NT, No erythema  Lab Results: Recent Labs    07/12/22 0736  HGB 11.8*  HCT 34.8*   BMET Recent Labs    07/12/22 0736 07/14/22 0453  NA 134* 129*  K 4.1 4.6  CL 101 93*  CO2 26 29  GLUCOSE 203* 330*  BUN 8 11  CREATININE 0.48* 0.72  CALCIUM 7.9* 8.3*     Studies/Results: ECHOCARDIOGRAM COMPLETE  Result Date: 07/13/2022    ECHOCARDIOGRAM REPORT   Patient Name:   Alejandro Santiago Date of Exam: 07/13/2022 Medical Rec #:  [id number]       Height:       72.0 in Accession #:    [id number]      Weight:       160.0 lb Date of Birth:  1976-02-21        BSA:          1.938 m Patient Age:    46 years        BP:           134/83 mmHg Patient Gender: M               HR:           76 bpm. Exam Location:  Inpatient Procedure: 2D Echo, Cardiac Doppler and Color Doppler Indications:    Abscess  History:        Patient has no prior history of Echocardiogram examinations.                 Prostate abscess;  Risk Factors:Diabetes, Current Smoker and                 Hypertension.  Sonographer:    Wallie Char Referring Phys: [id number] [facility] IMPRESSIONS  1. Left ventricular ejection fraction, by estimation, is 60 to 65%. The left ventricle has normal function. The left ventricle has no regional wall motion abnormalities. Left ventricular diastolic parameters were normal.  2. Right ventricular systolic function is normal. The right ventricular size is normal.  3. The mitral valve is normal in structure. Trivial mitral valve regurgitation. No evidence of mitral stenosis.  4. The aortic valve is normal in structure. Aortic valve regurgitation is not visualized. No aortic stenosis is present.  5. The inferior vena cava is normal in size with greater than 50% respiratory variability, suggesting right atrial pressure of 3 mmHg. Conclusion(s)/Recommendation(s): No evidence of valvular vegetations on this transthoracic echocardiogram.  Consider a transesophageal echocardiogram to exclude infective endocarditis if clinically indicated. FINDINGS  Left Ventricle: Left ventricular ejection fraction, by estimation, is 60 to 65%. The left ventricle has normal function. The left ventricle has no regional wall motion abnormalities. The left ventricular internal cavity size was normal in size. There is  no left ventricular hypertrophy. Left ventricular diastolic parameters were normal. Right Ventricle: The right ventricular size is normal. No increase in right ventricular wall thickness. Right ventricular systolic function is normal. Left Atrium: Left atrial size was normal in size. Right Atrium: Right atrial size was normal in size. Pericardium: There is no evidence of pericardial effusion. Mitral Valve: The mitral valve is normal in structure. Trivial mitral valve regurgitation. No evidence of mitral valve stenosis. MV peak gradient, 4.1 mmHg. The mean mitral valve gradient is 2.0 mmHg. Tricuspid Valve: The tricuspid valve is  normal in structure. Tricuspid valve regurgitation is trivial. No evidence of tricuspid stenosis. Aortic Valve: The aortic valve is normal in structure. Aortic valve regurgitation is not visualized. No aortic stenosis is present. Aortic valve mean gradient measures 5.0 mmHg. Aortic valve peak gradient measures 9.1 mmHg. Aortic valve area, by VTI measures 2.65 cm. Pulmonic Valve: The pulmonic valve was not well visualized. Pulmonic valve regurgitation is not visualized. No evidence of pulmonic stenosis. Aorta: The aortic root is normal in size and structure. Venous: The inferior vena cava is normal in size with greater than 50% respiratory variability, suggesting right atrial pressure of 3 mmHg. IAS/Shunts: No atrial level shunt detected by color flow Doppler.  LEFT VENTRICLE PLAX 2D LVIDd:         4.80 cm     Diastology LVIDs:         3.10 cm     LV e' medial:    9.82 cm/s LV PW:         1.00 cm     LV E/e' medial:  8.2 LV IVS:        0.80 cm     LV e' lateral:   11.70 cm/s LVOT diam:     1.90 cm     LV E/e' lateral: 6.9 LV SV:         76 LV SV Index:   39 LVOT Area:     2.84 cm  LV Volumes (MOD) LV vol d, MOD A2C: 64.7 ml LV vol d, MOD A4C: 87.9 ml LV vol s, MOD A2C: 21.7 ml LV vol s, MOD A4C: 28.8 ml LV SV MOD A2C:     43.0 ml LV SV MOD A4C:     87.9 ml LV SV MOD BP:      52.0 ml RIGHT VENTRICLE             IVC RV Basal diam:  3.40 cm     IVC diam: 1.80 cm RV S prime:     18.50 cm/s TAPSE (M-mode): 2.7 cm LEFT ATRIUM             Index        RIGHT ATRIUM           Index LA diam:        4.10 cm 2.12 cm/m   RA Area:     15.80 cm LA Vol (A2C):   53.7 ml 27.71 ml/m  RA Volume:   40.30 ml  20.80 ml/m LA Vol (A4C):   41.9 ml 21.62 ml/m LA Biplane Vol: 49.3 ml 25.44 ml/m  AORTIC VALVE AV Area (Vmax):  2.54 cm AV Area (Vmean):   2.51 cm AV Area (VTI):     2.65 cm AV Vmax:           150.50 cm/s AV Vmean:          106.150 cm/s AV VTI:            0.286 m AV Peak Grad:      9.1 mmHg AV Mean Grad:      5.0 mmHg  LVOT Vmax:         135.00 cm/s LVOT Vmean:        94.100 cm/s LVOT VTI:          0.267 m LVOT/AV VTI ratio: 0.94  AORTA Ao Root diam: 3.40 cm Ao Asc diam:  2.80 cm MITRAL VALVE               TRICUSPID VALVE MV Area (PHT): 3.08 cm    TR Peak grad:   26.6 mmHg MV Area VTI:   2.68 cm    TR Vmax:        258.00 cm/s MV Peak grad:  4.1 mmHg MV Mean grad:  2.0 mmHg    SHUNTS MV Vmax:       1.01 m/s    Systemic VTI:  0.27 m MV Vmean:      59.7 cm/s   Systemic Diam: 1.90 cm MV Decel Time: 246 msec MV E velocity: 80.40 cm/s MV A velocity: 80.80 cm/s MV E/A ratio:  1.00 Kardie Tobb DO Electronically signed by Thomasene Ripple DO Signature Date/Time: 07/13/2022/12:35:19 PM    Final    CT CHEST W CONTRAST  Result Date: 07/12/2022 CLINICAL DATA:  Evaluation of septic pulmonary emboli in the setting of MRSA prostatic abscesses EXAM: CT CHEST WITH CONTRAST TECHNIQUE: Multidetector CT imaging of the chest was performed during intravenous contrast administration. RADIATION DOSE REDUCTION: This exam was performed according to the departmental dose-optimization program which includes automated exposure control, adjustment of the mA and/or kV according to patient size and/or use of iterative reconstruction technique. CONTRAST:  75mL OMNIPAQUE IOHEXOL 300 MG/ML  SOLN COMPARISON:  Chest radiograph dated 07/09/2022 FINDINGS: Cardiovascular: Normal heart size. No significant pericardial fluid/thickening. Great vessels are normal in course and caliber. No central pulmonary emboli. Coronary artery calcifications. Mediastinum/Nodes: Imaged thyroid gland without nodules meeting criteria for imaging follow-up by size. Normal esophagus. No pathologically enlarged axillary, supraclavicular, mediastinal, or hilar lymph nodes. Lungs/Pleura: The central airways are patent. Diffuse right upper lobe tree-in-bud nodules and dependent left lower lobe with additional scattered nodules measuring up to 6 x 4 mm in the right lower lobe (6:92) 1.8 x 0.7 cm in  the left lower lobe (6:102). 5 x 5 mm perifissural right middle lobe nodule (6: 86) is most often an intrapulmonary lymph node. Lateral interlobular septal thickening. No pneumothorax. Trace bilateral pleural effusions. Upper abdomen: Similar splenomegaly measures 15.2 cm. Musculoskeletal: No acute or abnormal lytic or blastic osseous lesions. IMPRESSION: 1. Diffuse right upper lobe tree-in-bud nodules and scattered nodularity, likely infectious/inflammatory. Dependent left lower lobe nodules measuring up to 1.8 x 0.7 cm are in a distribution typical of septic pulmonary emboli without current necrosis. Non-contrast chest CT at 3-6 months is recommended. If the nodules are stable at time of repeat CT, then future CT at 18-24 months (from today's scan) is considered optional for low-risk patients, but is recommended for high-risk patients. This recommendation follows the consensus statement: Guidelines for Management of Incidental Pulmonary Nodules Detected on CT Images: From  the Fleischner Society 2017; Radiology 2017; 284:228-243. 2. Trace bilateral pleural effusions. 3. Similar splenomegaly measures 15.2 cm. 4. Coronary artery calcifications. Assessment for potential risk factor modification, dietary therapy or pharmacologic therapy may be warranted, if clinically indicated. Electronically Signed   By: Agustin Cree M.D.   On: 07/12/2022 15:55    Assessment/Plan: #Prostate abscess - S/p OR for unroofing on 07/10/22 - TOV tomorrow morning. Order placed - BCX NGTD; Ucx + MRSA - ID following. IV Vanc - no hematuria. - good UOP    #Left renal lesion - suspicious for malignancy; will discuss further with patient while in-house and set-up for outpatient surveillance and discussions regarding management   #Hyperglycemia - Per primary team  #Constipation - Daily Miralax until one bowel movement per day - Up OOB if not sleeping - ambulate as tolerated.    LOS: 5 days   Scherrie Bateman [place] 07/14/2022,  10:13 AM  Attending attestation: Patient was seen and examined on rounds. Agree with assessment and plan. Discussed with NP [place].

## 2022-07-14 NOTE — Progress Notes (Signed)
[facility] Infectious Diseases Follow Up Note  Patient Identification: Patient Name: Alejandro Santiago MRN: [id number] Admit Date: 07/09/2022 11:01 AM Age: 47 y.o.Today's Date: 07/14/2022   Reason for Visit: MRSA infection   Principal Problem:   Prostate abscess Active Problems:   Diabetes mellitus type 2 in nonobese [facility])  Antibiotics:  Vancomycin 5/12- Ceftriaxone 5/10-5/12 Ciprofloxacin 5/11 Metronidazole 5/10   Lines/Hardware: No known   Interval Events: Remains afebrile Labs remarkable for Na 129 CT chest 5/13 and TTE 5/14 findings noted Assessment 3-year-old male with PMH of hypertension, DM2, cocaine abuse who presented with multiple complaints. Admitted with    # Prostatic abscess/cystitis  5/10 urine cx greater than 100,000 MRSA S/p TURP on 5/11. OR with purulence noted. Cx MRSA  TOV tomorrow per Urology    # Multiple Pulmonary nodules r/o septic pulmonary emboli 2/2 MRSA   # Multiple pustular lesions in different parts of the body ( rt cheek, rt eyebrow, rt lateral neck, rt lateral scalp, webspace between left thumb and index finger, left distal forearm) - likely all MRSA related, improving on abtx    #  Thick-walled hypoenhancing exophytic lesion along the anterolateral left interpolar kidney measures 1.4 x 1.0 cm, suspicious for hypoenhancing primary renal neoplasm - non emergent renal mass protocol MRI or CT per Urology guidance    # Polysubstance use  $ Groin fungal rash - Ketoconazole cream per primary    Recommendations Continue Vancomycin, pharmacy to dose  Will need TEE, high concerns for rt sided endocarditis with h/o substance use and possible MRSA related diffuse skin infections  Monitor CBC, BMP and Vancomycin trough  Following  Rest of the management as per the primary team. Thank you for the consult. Please page with pertinent questions or  concerns.  ______________________________________________________________________ Subjective patient seen and examined at the bedside. Reports an elderly woman his caretaker had diffuse skin rashes which was thought to be shingles at that time and he think he could have caught MRSA from her. Feels all skin bumps are improving   Vitals BP (!) 142/90 (BP Location: Right Arm)   Pulse 80   Temp 98.2 F (36.8 C) (Oral)   Resp 18   Ht 6' (1.829 m)   Wt 72.6 kg   SpO2 100%   BMI 21.70 kg/m     Physical Exam Constitutional:  adult male sitting in the bed and appears comfortable     Comments:   Cardiovascular:     Rate and Rhythm: Normal rate and regular rhythm.     Heart sounds:  Pulmonary:     Effort: Pulmonary effort is normal.     Comments:   Abdominal:     Palpations: Abdomen is soft.     Tenderness:   Musculoskeletal:        General: No swelling or tenderness in peripheral joints   Skin:    Comments: multiple skin bumps in different parts of the body as listed above- all improving on abtx   Neurological:     General: awake, alert and oriented, grossly non focal   Psychiatric:        Mood and Affect: Mood normal.   Pertinent Microbiology Results for orders placed or performed during the hospital encounter of 07/09/22  Urine Culture     Status: Abnormal   Collection Time: 07/09/22 12:38 PM   Specimen: Urine, Clean Catch  Result Value Ref Range Status   Specimen Description   Final    URINE, CLEAN CATCH Performed at [facility], [address] [place], [place] [address]    Special Requests   Final    NONE Performed at [facility], [address], [place], [place] [address]    Culture (A)  Final    >=100,000 COLONIES/mL METHICILLIN RESISTANT STAPHYLOCOCCUS AUREUS   Report Status 07/11/2022 FINAL  Final   Organism ID, Bacteria METHICILLIN RESISTANT STAPHYLOCOCCUS AUREUS (A)  Final      Susceptibility   Methicillin  resistant staphylococcus aureus - MIC*    CIPROFLOXACIN >=8 RESISTANT Resistant     GENTAMICIN <=0.5 SENSITIVE Sensitive     NITROFURANTOIN <=16 SENSITIVE Sensitive     OXACILLIN >=4 RESISTANT Resistant     TETRACYCLINE <=1 SENSITIVE Sensitive     VANCOMYCIN <=0.5 SENSITIVE Sensitive     TRIMETH/SULFA >=320 RESISTANT Resistant     CLINDAMYCIN <=0.25 SENSITIVE Sensitive     RIFAMPIN <=0.5 SENSITIVE Sensitive     Inducible Clindamycin NEGATIVE Sensitive     LINEZOLID 2 SENSITIVE Sensitive     * >=100,000 COLONIES/mL METHICILLIN RESISTANT STAPHYLOCOCCUS AUREUS  Blood culture (routine x 2)     Status: None (Preliminary result)   Collection Time: 07/09/22  8:53 PM   Specimen: BLOOD  Result Value Ref Range Status   Specimen Description   Final    BLOOD BLOOD RIGHT ARM Performed at [facility], [address] [place], [place] [address]    Special Requests   Final    BOTTLES DRAWN AEROBIC ONLY Blood Culture adequate volume Performed at [facility], [address], [place], [place] [address]    Culture   Final    NO GROWTH 4 DAYS Performed at [facility], [address] [place], [place] [address]    Report Status PENDING  Incomplete  Blood culture (routine x 2)     Status: None (Preliminary result)   Collection Time: 07/09/22  8:58 PM   Specimen: BLOOD  Result Value Ref Range Status   Specimen Description   Final    BLOOD BLOOD LEFT ARM Performed at [facility], [address], [place], [place] [address]    Special Requests   Final    BOTTLES DRAWN AEROBIC ONLY Blood Culture adequate volume Performed at [facility], [address] [place], [place] [address]    Culture   Final    NO GROWTH 4 DAYS Performed at [facility], [address], [place], [place] [address]    Report Status PENDING  Incomplete  Urine Culture     Status: Abnormal   Collection Time: 07/10/22 10:01 AM    Specimen: Urine, Cystoscope  Result Value Ref Range Status   Specimen Description   Final    CYSTOSCOPY Performed at [facility], [address], [place], [place] [address]    Special Requests   Final    NONE Performed at [facility], [address] [place], [place] [address]    Culture (A)  Final    90,000 COLONIES/mL METHICILLIN RESISTANT STAPHYLOCOCCUS AUREUS   Report Status 07/12/2022 FINAL  Final   Organism ID, Bacteria METHICILLIN RESISTANT STAPHYLOCOCCUS AUREUS (A)  Final      Susceptibility   Methicillin resistant staphylococcus aureus - MIC*    CIPROFLOXACIN >=8 RESISTANT Resistant     ERYTHROMYCIN <=0.25 SENSITIVE Sensitive     GENTAMICIN <=0.5 SENSITIVE Sensitive     OXACILLIN >=4 RESISTANT Resistant     TETRACYCLINE <=1  SENSITIVE Sensitive     VANCOMYCIN <=0.5 SENSITIVE Sensitive     TRIMETH/SULFA >=320 RESISTANT Resistant     CLINDAMYCIN <=0.25 SENSITIVE Sensitive     RIFAMPIN <=0.5 SENSITIVE Sensitive     Inducible Clindamycin NEGATIVE Sensitive     LINEZOLID 2 SENSITIVE Sensitive     * 90,000 COLONIES/mL METHICILLIN RESISTANT STAPHYLOCOCCUS AUREUS   Pertinent Lab.    Latest Ref Rng & Units 07/12/2022    7:36 AM 07/10/2022   12:35 PM 07/09/2022    8:53 PM  CBC  WBC 4.0 - 10.5 K/uL 11.2  15.5  17.8   Hemoglobin 13.0 - 17.0 g/dL 78.2  95.6  21.3   Hematocrit 39.0 - 52.0 % 34.8  35.0  37.7   Platelets 150 - 400 K/uL 255  290  339       Latest Ref Rng & Units 07/14/2022    4:53 AM 07/12/2022    7:36 AM 07/10/2022   12:35 PM  CMP  Glucose 70 - 99 mg/dL 086  578  469   BUN 6 - 20 mg/dL 11  8  9    Creatinine 0.61 - 1.24 mg/dL 6.29  5.28  4.13   Sodium 135 - 145 mmol/L 129  134  132   Potassium 3.5 - 5.1 mmol/L 4.6  4.1  2.8   Chloride 98 - 111 mmol/L 93  101  97   CO2 22 - 32 mmol/L 29  26  27    Calcium 8.9 - 10.3 mg/dL 8.3  7.9  7.9      Pertinent Imaging today Plain films and CT images have been personally  visualized and interpreted; radiology reports have been reviewed. Decision making incorporated into the Impression /   ECHOCARDIOGRAM COMPLETE  Result Date: 07/13/2022    ECHOCARDIOGRAM REPORT   Patient Name:   PABLO HEMSATH Date of Exam: 07/13/2022 Medical Rec #:  [id number]       Height:       72.0 in Accession #:    [id number]      Weight:       160.0 lb Date of Birth:  [birth year]        BSA:          1.938 m Patient Age:    46 years        BP:           134/83 mmHg Patient Gender: M               HR:           76 bpm. Exam Location:  Inpatient Procedure: 2D Echo, Cardiac Doppler and Color Doppler Indications:    Abscess  History:        Patient has no prior history of Echocardiogram examinations.                 Prostate abscess; Risk Factors:Diabetes, Current Smoker and                 Hypertension.  Sonographer:    Wallie Char Referring Phys: [id number] [facility] IMPRESSIONS  1. Left ventricular ejection fraction, by estimation, is 60 to 65%. The left ventricle has normal function. The left ventricle has no regional wall motion abnormalities. Left ventricular diastolic parameters were normal.  2. Right ventricular systolic function is normal. The right ventricular size is normal.  3. The mitral valve is normal in structure. Trivial mitral valve regurgitation. No evidence of mitral stenosis.  4. The aortic valve is  normal in structure. Aortic valve regurgitation is not visualized. No aortic stenosis is present.  5. The inferior vena cava is normal in size with greater than 50% respiratory variability, suggesting right atrial pressure of 3 mmHg. Conclusion(s)/Recommendation(s): No evidence of valvular vegetations on this transthoracic echocardiogram. Consider a transesophageal echocardiogram to exclude infective endocarditis if clinically indicated. FINDINGS  Left Ventricle: Left ventricular ejection fraction, by estimation, is 60 to 65%. The left ventricle has normal function. The left ventricle has  no regional wall motion abnormalities. The left ventricular internal cavity size was normal in size. There is  no left ventricular hypertrophy. Left ventricular diastolic parameters were normal. Right Ventricle: The right ventricular size is normal. No increase in right ventricular wall thickness. Right ventricular systolic function is normal. Left Atrium: Left atrial size was normal in size. Right Atrium: Right atrial size was normal in size. Pericardium: There is no evidence of pericardial effusion. Mitral Valve: The mitral valve is normal in structure. Trivial mitral valve regurgitation. No evidence of mitral valve stenosis. MV peak gradient, 4.1 mmHg. The mean mitral valve gradient is 2.0 mmHg. Tricuspid Valve: The tricuspid valve is normal in structure. Tricuspid valve regurgitation is trivial. No evidence of tricuspid stenosis. Aortic Valve: The aortic valve is normal in structure. Aortic valve regurgitation is not visualized. No aortic stenosis is present. Aortic valve mean gradient measures 5.0 mmHg. Aortic valve peak gradient measures 9.1 mmHg. Aortic valve area, by VTI measures 2.65 cm. Pulmonic Valve: The pulmonic valve was not well visualized. Pulmonic valve regurgitation is not visualized. No evidence of pulmonic stenosis. Aorta: The aortic root is normal in size and structure. Venous: The inferior vena cava is normal in size with greater than 50% respiratory variability, suggesting right atrial pressure of 3 mmHg. IAS/Shunts: No atrial level shunt detected by color flow Doppler.  LEFT VENTRICLE PLAX 2D LVIDd:         4.80 cm     Diastology LVIDs:         3.10 cm     LV e' medial:    9.82 cm/s LV PW:         1.00 cm     LV E/e' medial:  8.2 LV IVS:        0.80 cm     LV e' lateral:   11.70 cm/s LVOT diam:     1.90 cm     LV E/e' lateral: 6.9 LV SV:         76 LV SV Index:   39 LVOT Area:     2.84 cm  LV Volumes (MOD) LV vol d, MOD A2C: 64.7 ml LV vol d, MOD A4C: 87.9 ml LV vol s, MOD A2C: 21.7 ml LV  vol s, MOD A4C: 28.8 ml LV SV MOD A2C:     43.0 ml LV SV MOD A4C:     87.9 ml LV SV MOD BP:      52.0 ml RIGHT VENTRICLE             IVC RV Basal diam:  3.40 cm     IVC diam: 1.80 cm RV S prime:     18.50 cm/s TAPSE (M-mode): 2.7 cm LEFT ATRIUM             Index        RIGHT ATRIUM           Index LA diam:        4.10 cm 2.12 cm/m   RA Area:  15.80 cm LA Vol (A2C):   53.7 ml 27.71 ml/m  RA Volume:   40.30 ml  20.80 ml/m LA Vol (A4C):   41.9 ml 21.62 ml/m LA Biplane Vol: 49.3 ml 25.44 ml/m  AORTIC VALVE AV Area (Vmax):    2.54 cm AV Area (Vmean):   2.51 cm AV Area (VTI):     2.65 cm AV Vmax:           150.50 cm/s AV Vmean:          106.150 cm/s AV VTI:            0.286 m AV Peak Grad:      9.1 mmHg AV Mean Grad:      5.0 mmHg LVOT Vmax:         135.00 cm/s LVOT Vmean:        94.100 cm/s LVOT VTI:          0.267 m LVOT/AV VTI ratio: 0.94  AORTA Ao Root diam: 3.40 cm Ao Asc diam:  2.80 cm MITRAL VALVE               TRICUSPID VALVE MV Area (PHT): 3.08 cm    TR Peak grad:   26.6 mmHg MV Area VTI:   2.68 cm    TR Vmax:        258.00 cm/s MV Peak grad:  4.1 mmHg MV Mean grad:  2.0 mmHg    SHUNTS MV Vmax:       1.01 m/s    Systemic VTI:  0.27 m MV Vmean:      59.7 cm/s   Systemic Diam: 1.90 cm MV Decel Time: 246 msec MV E velocity: 80.40 cm/s MV A velocity: 80.80 cm/s MV E/A ratio:  1.00 Kardie Tobb DO Electronically signed by Thomasene Ripple DO Signature Date/Time: 07/13/2022/12:35:19 PM    Final     I have personally spent 55 minutes involved in face-to-face and non-face-to-face activities for this patient on the day of the visit. Professional time spent includes the following activities: Preparing to see the patient (review of tests), Obtaining and/or reviewing separately obtained history (admission/discharge record), Performing a medically appropriate examination and/or evaluation , Ordering medications/tests/procedures, referring and communicating with other health care professionals, Documenting clinical  information in the EMR, Independently interpreting results (not separately reported), Communicating results to the patient/family/caregiver, Counseling and educating the patient/family/caregiver and Care coordination (not separately reported).   Plan d/w requesting provider as well as ID pharm D  Note: This document was prepared using dragon voice recognition software and may include unintentional dictation errors.   Electronically signed by:   Odette Fraction, MD Infectious Disease Physician [facility] Pager: [phone]

## 2022-07-14 NOTE — Progress Notes (Incomplete)
Pharmacy Antibiotic Note  Badr Crisantos is a 47 y.o. male admitted on 07/09/2022 with {Indications:3041527}.  Pharmacy has been consulted for *** dosing.  Plan: {Assessment:21075}  Height: 6' (182.9 cm) Weight: 72.6 kg (160 lb) IBW/kg (Calculated) : 77.6  Temp (24hrs), Avg:98.3 F (36.8 C), Min:97.9 F (36.6 C), Max:98.9 F (37.2 C)  Recent Labs  Lab 07/09/22 1122 07/09/22 1248 07/09/22 2053 07/10/22 1235 07/12/22 0736 07/14/22 0453  WBC 16.8*  --  17.8* 15.5* 11.2*  --   CREATININE  --  0.70 0.74 0.53* 0.48* 0.72    Estimated Creatinine Clearance: 117.2 mL/min (by C-G formula based on SCr of 0.72 mg/dL).    No Known Allergies  Antimicrobials this admission: *** *** >> *** *** *** >> ***  Dose adjustments this admission: ***  Microbiology results: *** BCx: *** *** UCx: ***  *** Sputum: ***  *** MRSA PCR: ***  Thank you for allowing pharmacy to be a part of this patient's care.  Rolley Sims 07/14/2022 9:27 AM

## 2022-07-14 NOTE — Progress Notes (Signed)
ID Brief note   Remains afebrile CT chest 5/13 with findings concerning for septic emboli  TTE  5/14 with no vegetations or endocarditis   Recommend TEE to r/o vegetations/endocarditis, high clinical suspicion although negative blood cultures  Continue Vancomycin, pharmacy to dose Following   Odette Fraction, MD Infectious Disease Physician [facility] [address] [place], [place] [address] Phone: [phone]  Fax: [phone]

## 2022-07-14 NOTE — Hospital Course (Addendum)
47 y.o. male with no medical history who began to have pain in his "urethra" about 3 days prior to admit. He reported dysuria. No hematuria. He had sweats and chills. Pain was noted in the scrotum and anus without discharge. Patient was noted to have elevated glucose levels and was not aware that he is a diabetic.    ED Course:  CT abdomen pelvis-multiloculated rim-enhancing fluid collections within the prostate gland measuring up to 5.7 x 3.3 cm - Circumferential mural thickening and pericystic stranding suggestive of cystitis - Thick-walled hypoenhancing exophytic lesion in the left kidney suspicious for primary renal neoplasm - Left lower lobe groundglass nodule with additional scattered solid nodules in bilateral lower lobes   Patient was noted to be cocaine positive. Also noted on exam were numerous maculopapular lesions which he states started about 2 weeks prior to admit.  He states that he does not use any IV drugs

## 2022-07-14 NOTE — Progress Notes (Signed)
Progress Note   Patient: Alejandro Santiago ZHY:[id number] DOB: [birth year] DOA: 07/09/2022     5 DOS: the patient was seen and examined on 07/14/2022   Brief hospital course: 47 y.o. male with no medical history who began to have pain in his "urethra" about 3 days ago. He has dysuria. No hematuria. He has had sweats and chills. Pain is currently in scrotum and anus and is 10/10 right now.  No discharge. He has one male partner over the last 12 years and this is his girlfriend. No h/o STDs.  He is also noted to have elevated glucose levels and is not aware that he is a diabetic.    ED Course:  CT abdomen pelvis-multiloculated rim-enhancing fluid collections within the prostate gland measuring up to 5.7 x 3.3 cm - Circumferential mural thickening and pericystic stranding suggestive of cystitis - Thick-walled hypoenhancing exophytic lesion in the left kidney suspicious for primary renal neoplasm - Left lower lobe groundglass nodule with additional scattered solid nodules in bilateral lower lobes   Sodium 133, chloride 91 WBC 16.8 Hemoglobin A1c 9.9 UA reveals WBC, nitrites, ketones and glucose but no bacteria noted   Cocaine positive Also noted on exam are numerous maculopapular lesions which he states started about 2 weeks ago.  He states that he does not use any IV drugs  Assessment and Plan: Principal Problem:   Prostate abscesses- Sepsis POA - 5/11 I and D  - has foley - HIV non reactive - U culture - MRSA-  blood cultures neg but still suspect secondary to bacteremia -Pt continued on vanc since 5/13 -Urology following. Per Urology, TOV planned 5/16 AM   Active Problems:  Extensive maculopapular and pustular rash, septic emboli to lung - rash improving - large pustular lesion on face has improved - osler node on right index finger - TTE done and pending, without evidence of endocarditis  -ID following. Have requested f/u TEE to r/o endocarditis     Diabetes mellitus type 2 in  nonobese (HCC) -Not in DKA - A1c 9.9 - Cont insulin sliding scale, diabetic diet - glycemic trends remain suboptimally controlled -Cont 3 units meal coverage. Have increased semglee to 23 units per diabetic coordinator recs   Rash in groin - continued on ketoconazole    Hyponatremia - He admits to poor oral intake + hyperglycemia over the past couple of days - cont with more aggressive glycemic control per above -Recheck bmet in AM   Hypokalemia -  replaced    Nicotine abuse -Smokes half a pack a day - Nicotine patch ordered   Cocaine abuse - Counseled this visit - Declined any IV drugs of abuse   Lung nodules and left renal lesion - Will need to follow-up - discussed with patinet      Subjective: Without complaints this AM  Physical Exam: Vitals:   07/13/22 0545 07/13/22 1152 07/13/22 2044 07/14/22 0601  BP: 134/83 (!) 157/97 (!) 164/95 131/79  Pulse: 71 81 81 73  Resp: 20 18 20 20   Temp: 97.7 F (36.5 C) 98.9 F (37.2 C) 97.9 F (36.6 C) 98 F (36.7 C)  TempSrc: Oral Oral Oral Oral  SpO2: 98% 99% 100% 100%  Weight:      Height:       General exam: Awake, laying in bed, in nad Respiratory system: Normal respiratory effort, no wheezing Cardiovascular system: regular rate, s1, s2 Gastrointestinal system: Soft, nondistended, positive BS Central nervous system: CN2-12 grossly intact, strength intact Extremities: Perfused, no clubbing  Skin: Normal skin turgor, no notable skin lesions seen Psychiatry: Mood normal // no visual hallucinations   Data Reviewed:  Labs reviewed: Na 129, K 4.6, Cr 0.72  Family Communication: Pt in room, family not at bedside  Disposition: Status is: Inpatient Remains inpatient appropriate because: Severity of illness  Planned Discharge Destination: Home    Author: Rickey Barbara, MD 07/14/2022 2:13 PM  For on call review [email or web address].

## 2022-07-15 ENCOUNTER — Other Ambulatory Visit (HOSPITAL_COMMUNITY): Payer: Self-pay

## 2022-07-15 ENCOUNTER — Other Ambulatory Visit: Payer: Self-pay

## 2022-07-15 LAB — CBC
HCT: 40.2 % (ref 39.0–52.0)
Hemoglobin: 13.5 g/dL (ref 13.0–17.0)
MCH: 31 pg (ref 26.0–34.0)
MCHC: 33.6 g/dL (ref 30.0–36.0)
MCV: 92.2 fL (ref 80.0–100.0)
Platelets: 416 10*3/uL — ABNORMAL HIGH (ref 150–400)
RBC: 4.36 MIL/uL (ref 4.22–5.81)
RDW: 12.1 % (ref 11.5–15.5)
WBC: 9.7 10*3/uL (ref 4.0–10.5)
nRBC: 0 % (ref 0.0–0.2)

## 2022-07-15 LAB — BASIC METABOLIC PANEL
Anion gap: 6 (ref 5–15)
BUN: 12 mg/dL (ref 6–20)
CO2: 26 mmol/L (ref 22–32)
Calcium: 8 mg/dL — ABNORMAL LOW (ref 8.9–10.3)
Chloride: 92 mmol/L — ABNORMAL LOW (ref 98–111)
Creatinine, Ser: 0.69 mg/dL (ref 0.61–1.24)
GFR, Estimated: >60 mL/min (ref >60)
Glucose, Bld: 333 mg/dL — ABNORMAL HIGH (ref 70–99)
Potassium: 4.2 mmol/L (ref 3.5–5.1)
Sodium: 124 mmol/L — ABNORMAL LOW (ref 135–145)

## 2022-07-15 LAB — GLUCOSE, CAPILLARY
Glucose-Capillary: 314 mg/dL — ABNORMAL HIGH (ref 70–99)
Glucose-Capillary: 256 mg/dL — ABNORMAL HIGH (ref 70–99)
Glucose-Capillary: 300 mg/dL — ABNORMAL HIGH (ref 70–99)
Glucose-Capillary: 301 mg/dL — ABNORMAL HIGH (ref 70–99)
Glucose-Capillary: 175 mg/dL — ABNORMAL HIGH (ref 70–99)

## 2022-07-15 LAB — VANCOMYCIN, PEAK
Vancomycin Pk: 17 ug/mL — ABNORMAL LOW (ref 30–40)
Vancomycin Pk: 22 ug/mL — ABNORMAL LOW (ref 30–40)

## 2022-07-15 LAB — OSMOLALITY: Osmolality: 286 mOsm/kg (ref 275–295)

## 2022-07-15 LAB — SODIUM, URINE, RANDOM: Sodium, Ur: 61 mmol/L

## 2022-07-15 LAB — VANCOMYCIN, TROUGH
Vancomycin Tr: 17 ug/mL (ref 15–20)
Vancomycin Tr: 9 ug/mL — ABNORMAL LOW (ref 15–20)

## 2022-07-15 LAB — CULTURE, BLOOD (ROUTINE X 2)
Culture: NO GROWTH
Special Requests: ADEQUATE

## 2022-07-15 LAB — OSMOLALITY, URINE: Osmolality, Ur: 333 mOsm/kg (ref 300–900)

## 2022-07-15 MED ORDER — VANCOMYCIN HCL 1750 MG/350ML IV SOLN
1750.0000 mg | Freq: Two times a day (BID) | INTRAVENOUS | Status: DC
  Administered 2022-07-15: 1750 mg via INTRAVENOUS
  Filled 2022-07-15 (×2): qty 350

## 2022-07-15 MED ORDER — INSULIN ASPART 100 UNIT/ML IJ SOLN
5.0000 [IU] | Freq: Three times a day (TID) | INTRAMUSCULAR | Status: DC
  Administered 2022-07-15 (×2): 5 [IU] via SUBCUTANEOUS

## 2022-07-15 MED ORDER — BACITRACIN-POLYMYXIN B 500-10000 UNIT/GM OP OINT
TOPICAL_OINTMENT | OPHTHALMIC | Status: DC
  Administered 2022-07-15 – 2022-07-17 (×5): 1 via OPHTHALMIC
  Filled 2022-07-15: qty 3.5

## 2022-07-15 MED ORDER — LIP MEDEX EX OINT
TOPICAL_OINTMENT | CUTANEOUS | Status: DC | PRN
  Filled 2022-07-15: qty 7

## 2022-07-15 MED ORDER — INSULIN ASPART PROT & ASPART (70-30 MIX) 100 UNIT/ML ~~LOC~~ SUSP
20.0000 [IU] | Freq: Two times a day (BID) | SUBCUTANEOUS | Status: DC
  Administered 2022-07-15 – 2022-07-18 (×7): 20 [IU] via SUBCUTANEOUS
  Filled 2022-07-15: qty 10

## 2022-07-15 MED ORDER — INSULIN GLARGINE-YFGN 100 UNIT/ML ~~LOC~~ SOLN
25.0000 [IU] | Freq: Every day | SUBCUTANEOUS | Status: DC
  Administered 2022-07-15: 25 [IU] via SUBCUTANEOUS
  Filled 2022-07-15: qty 0.25

## 2022-07-15 NOTE — TOC Progression Note (Addendum)
Transition of Care [facility]) - Progression Note    Patient Details  Name: Cashus Wagy MRN: [id number] Date of Birth: December 25, 1975  Transition of Care [facility]) CM/SW Contact  Kerryn Tennant, Olegario Messier, RN Phone Number: 07/15/2022, 11:18 AM  Clinical Narrative: Noted for TEE in am. Noted New onset DM.Spoke to patient about no insurance & meds-he states if meds are reasonably priced he can afford;hx SA- resources added to d/c instructions, Air traffic controller added.May need MATCH program depending on cost.Has own transport home.      Expected Discharge Plan: Home/Self Care Barriers to Discharge: Continued Medical Work up  Expected Discharge Plan and Services                                               Social Determinants of Health (SDOH) Interventions SDOH Screenings   Food Insecurity: No Food Insecurity (07/09/2022)  Housing: Low Risk  (07/09/2022)  Transportation Needs: No Transportation Needs (07/09/2022)  Utilities: Not At Risk (07/09/2022)  Tobacco Use: High Risk (07/11/2022)    Readmission Risk Interventions     No data to display

## 2022-07-15 NOTE — Progress Notes (Signed)
Pharmacy Antibiotic Note  Alejandro Santiago is a 47 y.o. male admitted on 07/09/2022 with sepsis.  Pharmacy has been consulted for Vanco dosing.  Active Problem(s): pain in his "urethra" about 3 days ago, scrotum and anus. No discharge. He has dysuria, sweats and chills   PMH: +cocaine, tobacco,   Significant events: numerous maculopapular lesions which he states started about 2 weeks ago.   ID: Sepsis, cystitis, prostate abscess, rash of unknown origin and significance - CT abdomen pelvis-multiloculated rim-enhancing fluid collections within the prostate gland measuring up to 5.7 x 3.3 cm- Circumferential mural thickening and pericystic stranding suggestive of cystitis  Extensive maculopapular rash - bacteremia? Blood cultures neg so far -Interestingly the rash is not on his back but is on all other parts of the body and therefore may be related to scratching - pustular lesion on face- have not expressed this  - On 5/15 Vanc peak is 22 and trough is 9. Will adjust dose to 1750 mg IV q12h   - HIV non reactive  5/10: >100,000 SA 5/10: BC x 2>>  Plan: Rocephin 2g IV q24h Change to Vancomycin 1750 mg IV Q 12 hrs. Goal AUC 400-550. Expected AUC: 529 SCr used: 0.8     Height: 6' (182.9 cm) Weight: 72.6 kg (160 lb) IBW/kg (Calculated) : 77.6  Temp (24hrs), Avg:98.1 F (36.7 C), Min:98 F (36.7 C), Max:98.3 F (36.8 C)  Recent Labs  Lab 07/09/22 1122 07/09/22 1248 07/09/22 2053 07/10/22 1235 07/12/22 0736 07/14/22 0453 07/15/22 0456 07/15/22 1036 07/15/22 1417 07/15/22 2051  WBC 16.8*  --  17.8* 15.5* 11.2*  --  9.7  --   --   --   CREATININE  --    < > 0.74 0.53* 0.48* 0.72 0.69  --   --   --   VANCOTROUGH  --   --   --   --   --   --   --  17  --  9*  VANCOPEAK  --   --   --   --   --   --  17*  --  22*  --    < > = values in this interval not displayed.     Estimated Creatinine Clearance: 117.2 mL/min (by C-G formula based on SCr of 0.69 mg/dL).    No Known  Allergies  Adalberto Cole, PharmD, BCPS 07/15/2022 9:38 PM

## 2022-07-15 NOTE — Progress Notes (Addendum)
Progress Note   Patient: Alejandro Santiago ZOX:[id number] DOB: December 26, 1975 DOA: 07/09/2022     6 DOS: the patient was seen and examined on 07/15/2022   Brief hospital course: 47 y.o. male with no medical history who began to have pain in his "urethra" about 3 days ago. He has dysuria. No hematuria. He has had sweats and chills. Pain is currently in scrotum and anus and is 10/10 right now.  No discharge. He has one male partner over the last 12 years and this is his girlfriend. No h/o STDs.  He is also noted to have elevated glucose levels and is not aware that he is a diabetic.    ED Course:  CT abdomen pelvis-multiloculated rim-enhancing fluid collections within the prostate gland measuring up to 5.7 x 3.3 cm - Circumferential mural thickening and pericystic stranding suggestive of cystitis - Thick-walled hypoenhancing exophytic lesion in the left kidney suspicious for primary renal neoplasm - Left lower lobe groundglass nodule with additional scattered solid nodules in bilateral lower lobes   Sodium 133, chloride 91 WBC 16.8 Hemoglobin A1c 9.9 UA reveals WBC, nitrites, ketones and glucose but no bacteria noted   Cocaine positive Also noted on exam are numerous maculopapular lesions which he states started about 2 weeks ago.  He states that he does not use any IV drugs  Assessment and Plan: Principal Problem:   Prostate abscesses- Sepsis POA - 5/11 I and D  - foley removed 5/17 - HIV non reactive - U culture - MRSA-  blood cultures neg but still suspect secondary to bacteremia -Pt continued on vanc since 5/13 -Urology following. Foley cath removed 5/16   Active Problems:  Extensive maculopapular and pustular rash, septic emboli to lung - rash improving - large pustular lesion on face has improved - osler node on right index finger - TTE done without evidence of endocarditis  -ID following. TEE is pending to r/o endocarditis     Diabetes mellitus type 2 in nonobese (HCC) -Not  in DKA - A1c 9.9 - Cont insulin sliding scale, diabetic diet - glycemic trends remainssuboptimally controlled -Increased meal coverage to 5 units and increased semglee to 25 units -Per Diabetic Coordinator, will change to 70/30 insulin at 20 units BID given cost and lack of insurance   Rash in groin - continued on ketoconazole    Hyponatremia - He admits to poor oral intake + hyperglycemia over the past couple of days - cont with more aggressive glycemic control per above -Na lower today at 124, corrects to 130 when accounting for glucose of 333 -Will f/u on serum and urine osm and urine Na   Hypokalemia -  replaced    Nicotine abuse -Smokes half a pack a day - Nicotine patch ordered   Cocaine abuse - Counseled this visit - Declined any IV drugs of abuse   Lung nodules and left renal lesion - Will need to follow-up - discussed with patinet  Conjunctivitis - redness and irritation involving R eye associated with crusting  - Start bacitracin-polymyxin eye ointment      Subjective: complaining of R eye discomfort and drainage  Physical Exam: Vitals:   07/14/22 1422 07/14/22 2016 07/15/22 0502 07/15/22 1342  BP: (!) 142/90  120/78 129/85  Pulse: 80 (!) 103 78 100  Resp: 18 20 20 20   Temp: 98.2 F (36.8 C) 98.3 F (36.8 C) 98 F (36.7 C) 98.1 F (36.7 C)  TempSrc: Oral Oral Oral Oral  SpO2: 100% 98% 98% 100%  Weight:  Height:       General exam: Conversant, in no acute distress, R conjunctiva erythematous Respiratory system: normal chest rise, clear, no audible wheezing Cardiovascular system: regular rhythm, s1-s2 Gastrointestinal system: Nondistended, nontender, pos BS Central nervous system: No seizures, no tremors Extremities: No cyanosis, no joint deformities Skin: No rashes, no pallor Psychiatry: Affect normal // no auditory hallucinations   Data Reviewed:  Labs reviewed: Na 129, K 4.6, Cr 0.72  Family Communication: Pt in room, family not at  bedside  Disposition: Status is: Inpatient Remains inpatient appropriate because: Severity of illness  Planned Discharge Destination: Home    Author: Rickey Barbara, MD 07/15/2022 3:02 PM  For on call review [email or web address].

## 2022-07-15 NOTE — Progress Notes (Signed)
5 Days Post-Op Subjective: S/p OR for transurethral unroofing of prostate.  NAEON.  It appears that his MRSA infection is widespread with probable septic emboli in the lungs.  I inquired as to any present or past IV drug use.  He is adamant that he has never done anything of the sort.  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   Objective: Vital signs in last 24 hours: Temp:  [98 F (36.7 C)-98.3 F (36.8 C)] 98 F (36.7 C) (05/16 0502) Pulse Rate:  [78-103] 78 (05/16 0502) Resp:  [18-20] 20 (05/16 0502) BP: (120-142)/(78-90) 120/78 (05/16 0502) SpO2:  [98 %-100 %] 98 % (05/16 0502)  Intake/Output from previous day: 05/15 0701 - 05/16 0700 In: 1290 [P.O.:240; I.V.:900; IV Piggyback:150] Out: 2850 [Urine:2850] Intake/Output this shift: Total I/O In: 720 [P.O.:720] Out: -   Physical Exam:  General: Alert and oriented CV: RRR Lungs: Clear Abdomen: Soft, ND GU: foley catheter draining clear yellow urine Ext: NT, No erythema  Lab Results: Recent Labs    07/15/22 0456  HGB 13.5  HCT 40.2   BMET Recent Labs    07/14/22 0453 07/15/22 0456  NA 129* 124*  K 4.6 4.2  CL 93* 92*  CO2 29 26  GLUCOSE 330* 333*  BUN 11 12  CREATININE 0.72 0.69  CALCIUM 8.3* 8.0*     Studies/Results: No results found.  Assessment/Plan: #Prostate abscess - S/p OR for unroofing on 07/10/22 -Foley removed the morning of 5/16.  Patient voiding without difficulty, clear yellow urine - BCX NGTD; Ucx + MRSA - ID following. IV Vanc  #Left renal lesion - suspicious for malignancy; will discuss further with patient while in-house and set-up for outpatient surveillance and discussions regarding management.     #Hyperglycemia - Per primary team  #Constipation - Daily Miralax until one bowel movement per day - Up OOB if not sleeping - ambulate as tolerated.   -We will follow along  peripherally.  Patient will follow-up in clinic within a couple of weeks for reassessment and to discuss next steps for this concerning renal lesion.  Plan to see him again towards the beginning of the week.  On the unlikely chance that he discharges over the weekend, please contact urology on-call to schedule follow-up.    LOS: 6 days   Scherrie Bateman Layli Capshaw 07/15/2022, 9:48 AM

## 2022-07-15 NOTE — Anesthesia Preprocedure Evaluation (Addendum)
Anesthesia Evaluation  Patient identified by MRN, date of birth, ID band Patient awake    Reviewed: Allergy & Precautions, NPO status , Patient's Chart, lab work & pertinent test results  History of Anesthesia Complications Negative for: history of anesthetic complications  Airway Mallampati: III  TM Distance: >3 FB Neck ROM: Full   Comment: Previous grade I view with MAC 4 Dental  (+) Dental Advisory Given,    Pulmonary neg shortness of breath, neg sleep apnea, neg COPD, neg recent URI, Current Smoker (smokes 1/2 ppd, last smoked 1 week ago) and Patient abstained from smoking. Multiple pulmonary nodules   Pulmonary exam normal breath sounds clear to auscultation       Cardiovascular hypertension, (-) angina (-) Past MI and (-) Cardiac Stents (-) dysrhythmias  Rhythm:Regular Rate:Normal  TTE 07/13/2022: IMPRESSIONS     1. Left ventricular ejection fraction, by estimation, is 60 to 65%. The  left ventricle has normal function. The left ventricle has no regional  wall motion abnormalities. Left ventricular diastolic parameters were  normal.   2. Right ventricular systolic function is normal. The right ventricular  size is normal.   3. The mitral valve is normal in structure. Trivial mitral valve  regurgitation. No evidence of mitral stenosis.   4. The aortic valve is normal in structure. Aortic valve regurgitation is  not visualized. No aortic stenosis is present.   5. The inferior vena cava is normal in size with greater than 50%  respiratory variability, suggesting right atrial pressure of 3 mmHg.     Neuro/Psych negative neurological ROS     GI/Hepatic negative GI ROS, Neg liver ROS,,,  Endo/Other  diabetes, Type 2    Renal/GU Renal disease (left renal lesion)     Musculoskeletal   Abdominal   Peds  Hematology negative hematology ROS (+)   Anesthesia Other Findings Prostate abscess s/p unroofing   Reproductive/Obstetrics                             Anesthesia Physical Anesthesia Plan  ASA: 3  Anesthesia Plan: MAC   Post-op Pain Management:    Induction: Intravenous  PONV Risk Score and Plan: 0 and Propofol infusion and Treatment may vary due to age or medical condition  Airway Management Planned: Natural Airway and Simple Face Mask  Additional Equipment:   Intra-op Plan:   Post-operative Plan:   Informed Consent: I have reviewed the patients History and Physical, chart, labs and discussed the procedure including the risks, benefits and alternatives for the proposed anesthesia with the patient or authorized representative who has indicated his/her understanding and acceptance.     Dental advisory given  Plan Discussed with: CRNA and Anesthesiologist  Anesthesia Plan Comments: (Discussed with patient risks of MAC including, but not limited to, minor pain or discomfort, hearing people in the room, and possible need for backup general anesthesia. Risks for general anesthesia also discussed including, but not limited to, sore throat, hoarse voice, chipped/damaged teeth, injury to vocal cords, nausea and vomiting, allergic reactions, lung infection, heart attack, stroke, and death. All questions answered. )       Anesthesia Quick Evaluation

## 2022-07-15 NOTE — Inpatient Diabetes Management (Signed)
Inpatient Diabetes Program Recommendations  AACE/ADA: New Consensus Statement on Inpatient Glycemic Control (2015)  Target Ranges:  Prepandial:   less than 140 mg/dL      Peak postprandial:   less than 180 mg/dL (1-2 hours)      Critically ill patients:  140 - 180 mg/dL   Lab Results  Component Value Date   GLUCAP 300 (H) 07/15/2022   HGBA1C 9.9 (H) 07/09/2022    Review of Glycemic Control  Diabetes history: New-onset DM Outpatient Diabetes medications: None Current orders for Inpatient glycemic control: Semglee 25 units QD, Novolog 0-15 TID with meals + 5 units TID  HgbA1C - 9.9% For TEE in am. Will need affordable insulin at discharge with no insurance coverage at present. Pt gave insulin injection at lunch today.  Inpatient Diabetes Program Recommendations:    Consider Novolin ReliOn 70/30 insulin for home - 20 units BID  Will need PCP to manage his diabetes after discharge. (States he has appt scheduled)  Again reviewed insulin pen administration.  Answered all questions.   Thank you. Ailene Ards, RD, LDN, CDCES Inpatient Diabetes Coordinator [phone]

## 2022-07-15 NOTE — Progress Notes (Signed)
   [facility] has been requested to perform a transesophageal echocardiogram on Isac Florentine to rule out endocarditis given history of substance use and diffuse skin infections.  After careful review of history and examination, the risks and benefits of transesophageal echocardiogram have been explained including risks of esophageal damage, perforation (1:10,000 risk), bleeding, pharyngeal hematoma as well as other potential complications associated with conscious sedation including aspiration, arrhythmia, respiratory failure and death. Alternatives to treatment were discussed, questions were answered. Patient is willing to proceed.   Procedure is scheduled for 07/16/2022 at 11:00am. Will make NPO at midnight and place pre-procedural orders.  Corrin Parker, PA-C 07/15/2022 11:11 AM

## 2022-07-16 ENCOUNTER — Encounter (HOSPITAL_COMMUNITY)
Admission: EM | Disposition: A | Discharge status: Other Acute Inpatient Hospital | Payer: Self-pay | Source: Home / Self Care | Attending: Internal Medicine

## 2022-07-16 ENCOUNTER — Inpatient Hospital Stay (HOSPITAL_COMMUNITY): Payer: Medicaid Other | Admitting: Anesthesiology

## 2022-07-16 ENCOUNTER — Encounter (HOSPITAL_COMMUNITY): Payer: Self-pay | Admitting: Internal Medicine

## 2022-07-16 ENCOUNTER — Inpatient Hospital Stay (HOSPITAL_COMMUNITY): Payer: Medicaid Other

## 2022-07-16 DIAGNOSIS — Z794 Long term (current) use of insulin: Secondary | ICD-10-CM

## 2022-07-16 DIAGNOSIS — E119 Type 2 diabetes mellitus without complications: Secondary | ICD-10-CM

## 2022-07-16 DIAGNOSIS — F17210 Nicotine dependence, cigarettes, uncomplicated: Secondary | ICD-10-CM

## 2022-07-16 DIAGNOSIS — I10 Essential (primary) hypertension: Secondary | ICD-10-CM

## 2022-07-16 DIAGNOSIS — I330 Acute and subacute infective endocarditis: Secondary | ICD-10-CM

## 2022-07-16 DIAGNOSIS — I38 Endocarditis, valve unspecified: Secondary | ICD-10-CM

## 2022-07-16 HISTORY — PX: TEE WITHOUT CARDIOVERSION: SHX5443

## 2022-07-16 LAB — GLUCOSE, CAPILLARY
Glucose-Capillary: 250 mg/dL — ABNORMAL HIGH (ref 70–99)
Glucose-Capillary: 173 mg/dL — ABNORMAL HIGH (ref 70–99)
Glucose-Capillary: 149 mg/dL — ABNORMAL HIGH (ref 70–99)
Glucose-Capillary: 279 mg/dL — ABNORMAL HIGH (ref 70–99)
Glucose-Capillary: 167 mg/dL — ABNORMAL HIGH (ref 70–99)

## 2022-07-16 LAB — COMPREHENSIVE METABOLIC PANEL
ALT: 13 U/L (ref 0–44)
AST: 18 U/L (ref 15–41)
Albumin: 2.8 g/dL — ABNORMAL LOW (ref 3.5–5.0)
Alkaline Phosphatase: 54 U/L (ref 38–126)
Anion gap: 7 (ref 5–15)
BUN: 13 mg/dL (ref 6–20)
CO2: 32 mmol/L (ref 22–32)
Calcium: 9.2 mg/dL (ref 8.9–10.3)
Chloride: 96 mmol/L — ABNORMAL LOW (ref 98–111)
Creatinine, Ser: 0.6 mg/dL — ABNORMAL LOW (ref 0.61–1.24)
GFR, Estimated: >60 mL/min (ref >60)
Glucose, Bld: 170 mg/dL — ABNORMAL HIGH (ref 70–99)
Potassium: 4.5 mmol/L (ref 3.5–5.1)
Sodium: 135 mmol/L (ref 135–145)
Total Bilirubin: 0.6 mg/dL (ref 0.3–1.2)
Total Protein: 7 g/dL (ref 6.5–8.1)

## 2022-07-16 LAB — CBC
HCT: 41.5 % (ref 39.0–52.0)
Hemoglobin: 14 g/dL (ref 13.0–17.0)
MCH: 31.1 pg (ref 26.0–34.0)
MCHC: 33.7 g/dL (ref 30.0–36.0)
MCV: 92.2 fL (ref 80.0–100.0)
Platelets: 453 10*3/uL — ABNORMAL HIGH (ref 150–400)
RBC: 4.5 MIL/uL (ref 4.22–5.81)
RDW: 12.3 % (ref 11.5–15.5)
WBC: 9.7 10*3/uL (ref 4.0–10.5)
nRBC: 0 % (ref 0.0–0.2)

## 2022-07-16 LAB — ECHO TEE

## 2022-07-16 SURGERY — ECHOCARDIOGRAM, TRANSESOPHAGEAL
Anesthesia: Monitor Anesthesia Care

## 2022-07-16 MED ORDER — LIDOCAINE 2% (20 MG/ML) 5 ML SYRINGE
INTRAMUSCULAR | Status: DC | PRN
  Administered 2022-07-16: 100 mg via INTRAVENOUS

## 2022-07-16 MED ORDER — VANCOMYCIN HCL 1250 MG/250ML IV SOLN
1250.0000 mg | Freq: Three times a day (TID) | INTRAVENOUS | Status: DC
  Administered 2022-07-16 – 2022-07-18 (×8): 1250 mg via INTRAVENOUS
  Filled 2022-07-16 (×11): qty 250

## 2022-07-16 MED ORDER — PROPOFOL 10 MG/ML IV BOLUS
INTRAVENOUS | Status: DC | PRN
  Administered 2022-07-16 (×3): 50 mg via INTRAVENOUS

## 2022-07-16 MED ORDER — SODIUM CHLORIDE 0.9 % IV SOLN: INTRAVENOUS | Status: DC

## 2022-07-16 MED ORDER — MORPHINE SULFATE (PF) 2 MG/ML IV SOLN
1.0000 mg | INTRAVENOUS | Status: AC | PRN
  Administered 2022-07-16: 1 mg via INTRAVENOUS
  Filled 2022-07-16: qty 1

## 2022-07-16 MED ORDER — SENNA 8.6 MG PO TABS
1.0000 | ORAL_TABLET | Freq: Every day | ORAL | Status: DC
  Administered 2022-07-16 – 2022-07-18 (×3): 8.6 mg via ORAL
  Filled 2022-07-16 (×3): qty 1

## 2022-07-16 MED ORDER — DOCUSATE SODIUM 100 MG PO CAPS
100.0000 mg | ORAL_CAPSULE | Freq: Two times a day (BID) | ORAL | Status: DC
  Administered 2022-07-16 – 2022-07-18 (×6): 100 mg via ORAL
  Filled 2022-07-16 (×5): qty 1

## 2022-07-16 MED ORDER — PROPOFOL 500 MG/50ML IV EMUL
INTRAVENOUS | Status: DC | PRN
  Administered 2022-07-16: 150 ug/kg/min via INTRAVENOUS

## 2022-07-16 NOTE — Inpatient Diabetes Management (Signed)
NeInpatient Diabetes Program Recommendations  AACE/ADA: New Consensus Statement on Inpatient Glycemic Control (2015)  Target Ranges:  Prepandial:   less than 140 mg/dL      Peak postprandial:   less than 180 mg/dL (1-2 hours)      Critically ill patients:  140 - 180 mg/dL   Lab Results  Component Value Date   GLUCAP 173 (H) 07/16/2022   HGBA1C 9.9 (H) 07/09/2022    Review of Glycemic Control  Diabetes history: New onset Outpatient Diabetes medications: None Current orders for Inpatient glycemic control: 70/30 20 units BID, Novolog 0-15 TID  Inpatient Diabetes Program Recommendations:    Discharge Recommendations: Intermediate acting recommendations: insulin isophane & regular (NOVOLIN 70/30 RELION PEN) ([facility] Only) 20 units BID  Supply/Referral recommendations: Glucometer Test strips Lancet device Lancets Pen needles - standard   Thank you. Ailene Ards, RD, LDN, CDCES Inpatient Diabetes Coordinator [phone]

## 2022-07-16 NOTE — Transfer of Care (Signed)
Immediate Anesthesia Transfer of Care Note  Patient: Alejandro Santiago  Procedure(s) Performed: TRANSESOPHAGEAL ECHOCARDIOGRAM  Patient Location: PACU  Anesthesia Type:MAC  Level of Consciousness: awake and patient cooperative  Airway & Oxygen Therapy: Patient Spontanous Breathing and Patient connected to nasal cannula oxygen  Post-op Assessment: Report given to RN and Post -op Vital signs reviewed and stable  Post vital signs: Reviewed and stable  Last Vitals:  Vitals Value Taken Time  BP    Temp 36.6 C 07/16/22 1138  Pulse 92 07/16/22 1140  Resp 25 07/16/22 1140  SpO2 100 % 07/16/22 1140  Vitals shown include unvalidated device data.  Last Pain:  Vitals:   07/16/22 1138  TempSrc: Temporal  PainSc: 0-No pain      Patients Stated Pain Goal: 2 (07/16/22 0451)  Complications: No notable events documented.

## 2022-07-16 NOTE — Plan of Care (Signed)

## 2022-07-16 NOTE — Progress Notes (Signed)
[facility] Infectious Diseases Follow Up Note  Patient Identification: Patient Name: Alejandro Santiago MRN: [id number] Admit Date: 07/09/2022 11:01 AM Age: 47 y.o.Today's Date: 07/16/2022   Reason for Visit: MRSA infection   Principal Problem:   Prostate abscess Active Problems:   Diabetes mellitus type 2 in nonobese [facility])  Antibiotics:  Vancomycin 5/12- Ceftriaxone 5/10-5/12 Ciprofloxacin 5/11 Metronidazole 5/10   Lines/Hardware: No known   Interval Events: Remains afebrile TEE today  Eye drops started for rt pink eye    Assessment 28-year-old male with PMH of hypertension, DM2, cocaine abuse who presented with multiple complaints. Admitted with    # Prostatic abscess/cystitis  5/10 urine cx greater than 100,000 MRSA S/p TURP on 5/11. OR with purulence noted. Cx MRSA  Foley removed 5/16 voiding without difficulty    # Multiple Pulmonary nodules, Possible septic pulmonary emboli 2/2 MRSA 5/17 TEE negative for vegetations or endocarditis   # Multiple pustular lesions( rt cheek, rt eyebrow, rt lateral neck, rt lateral scalp, webspace between left thumb and index finger, left distal forearm, few in the bilateral legs) - likely all MRSA related, improving on abtx   # Rt eye conjunctivitis - unlikely preseptal or orbital cellulitis - on eye drops   #  Left renal lesion suspicious for hypoenhancing primary renal neoplasm - non emergent renal mass protocol MRI or CT per Urology guidance    # Polysubstance use  # Groin fungal rash - Ketoconazole cream per primary    Recommendations Continue Vancomycin, pharmacy to dose  Although blood cx are negative and TEE negative for endocarditis, given chest CT findings concerning for septic pulmonary emboli, diffuse MRSA infections and h/o drug use, high clinical suspicion for rt sided endocarditis and will need prolonged IV abtx Can consider PO linezolid vs dalbavancin after 4 weeks  IV course to avoid prolonged need to stay in the hospital  Monitor CBC, BMP and Vancomycin trough  Dr Renold Don covering this weekend, Dr Thedore Mins here next week.   Rest of the management as per the primary team. Thank you for the consult. Please page with pertinent questions or concerns.  ______________________________________________________________________ Subjective patient seen and examined at the bedside. Reports pink rt eye. Denies any eye pain, headache, blurry vision.  Vitals BP 117/80 (BP Location: Left Arm)   Pulse 82   Temp 98.1 F (36.7 C) (Oral)   Resp 20   Ht 6' (1.829 m)   Wt 78.5 kg   SpO2 97%   BMI 23.47 kg/m     Physical Exam Constitutional:  adult male lying in the bed who just woke up     Comments: Rt eye conjunctivitis,unlikely preseptal or orbital cellulitis   Cardiovascular:     Rate and Rhythm: Normal rate and regular rhythm.     Heart sounds:  Pulmonary:     Effort: Pulmonary effort is normal.     Comments:   Abdominal:     Palpations: Abdomen is soft.     Tenderness:   Musculoskeletal:        General: No swelling or tenderness in peripheral joints   Skin:    Comments: multiple skin bumps in different parts of the body as listed above- all improving on abtx   Neurological:     General: awake, alert and oriented, grossly non focal   Psychiatric:        Mood and Affect: Mood normal.   Pertinent Microbiology Results for orders placed or performed during the hospital encounter of 07/09/22  Urine Culture  Status: Abnormal   Collection Time: 07/09/22 12:38 PM   Specimen: Urine, Clean Catch  Result Value Ref Range Status   Specimen Description   Final    URINE, CLEAN CATCH Performed at [facility], [address] [place], [place] [address]    Special Requests   Final    NONE Performed at [facility], [address] [place], [place] [address]    Culture (A)  Final    >=100,000 COLONIES/mL  METHICILLIN RESISTANT STAPHYLOCOCCUS AUREUS   Report Status 07/11/2022 FINAL  Final   Organism ID, Bacteria METHICILLIN RESISTANT STAPHYLOCOCCUS AUREUS (A)  Final      Susceptibility   Methicillin resistant staphylococcus aureus - MIC*    CIPROFLOXACIN >=8 RESISTANT Resistant     GENTAMICIN <=0.5 SENSITIVE Sensitive     NITROFURANTOIN <=16 SENSITIVE Sensitive     OXACILLIN >=4 RESISTANT Resistant     TETRACYCLINE <=1 SENSITIVE Sensitive     VANCOMYCIN <=0.5 SENSITIVE Sensitive     TRIMETH/SULFA >=320 RESISTANT Resistant     CLINDAMYCIN <=0.25 SENSITIVE Sensitive     RIFAMPIN <=0.5 SENSITIVE Sensitive     Inducible Clindamycin NEGATIVE Sensitive     LINEZOLID 2 SENSITIVE Sensitive     * >=100,000 COLONIES/mL METHICILLIN RESISTANT STAPHYLOCOCCUS AUREUS  Blood culture (routine x 2)     Status: None   Collection Time: 07/09/22  8:53 PM   Specimen: BLOOD  Result Value Ref Range Status   Specimen Description   Final    BLOOD BLOOD RIGHT ARM Performed at [facility], [address], [place], [place] [address]    Special Requests   Final    BOTTLES DRAWN AEROBIC ONLY Blood Culture adequate volume Performed at [facility], [address], [place], [place] [address]    Culture   Final    NO GROWTH 5 DAYS Performed at [facility], [address] [place], [place] [address]    Report Status 07/15/2022 FINAL  Final  Blood culture (routine x 2)     Status: None   Collection Time: 07/09/22  8:58 PM   Specimen: BLOOD  Result Value Ref Range Status   Specimen Description   Final    BLOOD BLOOD LEFT ARM Performed at [facility], [address], [place], [place] [address]    Special Requests   Final    BOTTLES DRAWN AEROBIC ONLY Blood Culture adequate volume Performed at [facility], [address] [place], [place] [address]    Culture   Final    NO GROWTH 5 DAYS Performed at [facility],  [address], [place], [place] [address]    Report Status 07/15/2022 FINAL  Final  Urine Culture     Status: Abnormal   Collection Time: 07/10/22 10:01 AM   Specimen: Urine, Cystoscope  Result Value Ref Range Status   Specimen Description   Final    CYSTOSCOPY Performed at [facility], [address], [place], [place] [address]    Special Requests   Final    NONE Performed at [facility], [address] [place], [place] [address]    Culture (A)  Final    90,000 COLONIES/mL METHICILLIN RESISTANT STAPHYLOCOCCUS AUREUS   Report Status 07/12/2022 FINAL  Final   Organism ID, Bacteria METHICILLIN RESISTANT STAPHYLOCOCCUS AUREUS (A)  Final      Susceptibility   Methicillin resistant staphylococcus aureus - MIC*  CIPROFLOXACIN >=8 RESISTANT Resistant     ERYTHROMYCIN <=0.25 SENSITIVE Sensitive     GENTAMICIN <=0.5 SENSITIVE Sensitive     OXACILLIN >=4 RESISTANT Resistant     TETRACYCLINE <=1 SENSITIVE Sensitive     VANCOMYCIN <=0.5 SENSITIVE Sensitive     TRIMETH/SULFA >=320 RESISTANT Resistant     CLINDAMYCIN <=0.25 SENSITIVE Sensitive     RIFAMPIN <=0.5 SENSITIVE Sensitive     Inducible Clindamycin NEGATIVE Sensitive     LINEZOLID 2 SENSITIVE Sensitive     * 90,000 COLONIES/mL METHICILLIN RESISTANT STAPHYLOCOCCUS AUREUS   Pertinent Lab.    Latest Ref Rng & Units 07/16/2022    5:08 AM 07/15/2022    4:56 AM 07/12/2022    7:36 AM  CBC  WBC 4.0 - 10.5 K/uL 9.7  9.7  11.2   Hemoglobin 13.0 - 17.0 g/dL 16.1  09.6  04.5   Hematocrit 39.0 - 52.0 % 41.5  40.2  34.8   Platelets 150 - 400 K/uL 453  416  255       Latest Ref Rng & Units 07/16/2022    5:08 AM 07/15/2022    4:56 AM 07/14/2022    4:53 AM  CMP  Glucose 70 - 99 mg/dL 409  811  914   BUN 6 - 20 mg/dL 13  12  11    Creatinine 0.61 - 1.24 mg/dL 7.82  9.56  2.13   Sodium 135 - 145 mmol/L 135  124  129   Potassium 3.5 - 5.1 mmol/L 4.5  4.2  4.6   Chloride 98 - 111 mmol/L 96  92  93   CO2  22 - 32 mmol/L 32  26  29   Calcium 8.9 - 10.3 mg/dL 9.2  8.0  8.3   Total Protein 6.5 - 8.1 g/dL 7.0     Total Bilirubin 0.3 - 1.2 mg/dL 0.6     Alkaline Phos 38 - 126 U/L 54     AST 15 - 41 U/L 18     ALT 0 - 44 U/L 13        Pertinent Imaging today Plain films and CT images have been personally visualized and interpreted; radiology reports have been reviewed. Decision making incorporated into the Impression /   ECHO TEE  Result Date: 07/16/2022    TRANSESOPHOGEAL ECHO REPORT   Patient Name:   AKITO LOPERA Date of Exam: 07/16/2022 Medical Rec #:  [id number]       Height:       72.0 in Accession #:    [id number]      Weight:       173.1 lb Date of Birth:  08/18/1975        BSA:          2.004 m Patient Age:    46 years        BP:           114/73 mmHg Patient Gender: M               HR:           86 bpm. Exam Location:  Inpatient Procedure: Transesophageal Echo, Color Doppler and Cardiac Doppler Indications:     endocarditis  History:         Patient has prior history of Echocardiogram examinations, most                  recent 07/13/2022. Risk Factors:Diabetes and Current Smoker.  Sonographer:     Delcie Roch  RDCS Referring Phys:  [id number] Corrin Parker Diagnosing Phys: Zoila Shutter MD PROCEDURE: After discussion of the risks and benefits of a TEE, an informed consent was obtained from the patient. The transesophogeal probe was passed without difficulty through the esophogus of the patient. Imaged were obtained with the patient in a supine position. Sedation performed by different physician. The patient was monitored while under deep sedation. Anesthestetic sedation was provided intravenously by Anesthesiology: 248mg  of Propofol, 100mg  of Lidocaine. The patient developed no complications during the procedure.  IMPRESSIONS  1. Left ventricular ejection fraction, by estimation, is 60 to 65%. The left ventricle has normal function. The left ventricle has no regional wall motion abnormalities.   2. Right ventricular systolic function is normal. The right ventricular size is normal.  3. No left atrial/left atrial appendage thrombus was detected.  4. The mitral valve is grossly normal. Trivial mitral valve regurgitation.  5. The aortic valve is tricuspid. Aortic valve regurgitation is not visualized. Conclusion(s)/Recommendation(s): No evidence of vegetation/infective endocarditis on this transesophageael echocardiogram. FINDINGS  Left Ventricle: Left ventricular ejection fraction, by estimation, is 60 to 65%. The left ventricle has normal function. The left ventricle has no regional wall motion abnormalities. The left ventricular internal cavity size was normal in size. There is  no left ventricular hypertrophy. Right Ventricle: The right ventricular size is normal. No increase in right ventricular wall thickness. Right ventricular systolic function is normal. Left Atrium: Left atrial size was normal in size. No left atrial/left atrial appendage thrombus was detected. Right Atrium: Right atrial size was normal in size. Prominent Eustachian valve. Pericardium: There is no evidence of pericardial effusion. Mitral Valve: The mitral valve is grossly normal. Trivial mitral valve regurgitation. Tricuspid Valve: The tricuspid valve is normal in structure. Tricuspid valve regurgitation is not demonstrated. Aortic Valve: The aortic valve is tricuspid. Aortic valve regurgitation is not visualized. Pulmonic Valve: The pulmonic valve was normal in structure. Pulmonic valve regurgitation is not visualized. Aorta: The aortic root and ascending aorta are structurally normal, with no evidence of dilitation. IAS/Shunts: No atrial level shunt detected by color flow Doppler. Zoila Shutter MD Electronically signed by Zoila Shutter MD Signature Date/Time: 07/16/2022/11:51:36 AM    Final    EP STUDY  Result Date: 07/16/2022 See surgical note for result.    I have personally spent 55 minutes involved in face-to-face and  non-face-to-face activities for this patient on the day of the visit. Professional time spent includes the following activities: Preparing to see the patient (review of tests), Obtaining and/or reviewing separately obtained history (admission/discharge record), Performing a medically appropriate examination and/or evaluation , Ordering medications/tests/procedures, referring and communicating with other health care professionals, Documenting clinical information in the EMR, Independently interpreting results (not separately reported), Communicating results to the patient/family/caregiver, Counseling and educating the patient/family/caregiver and Care coordination (not separately reported).   Plan d/w requesting provider as well as ID pharm D  Note: This document was prepared using dragon voice recognition software and may include unintentional dictation errors.   Electronically signed by:   Odette Fraction, MD Infectious Disease Physician [facility] for Infectious Disease Pager: [phone]

## 2022-07-16 NOTE — Interval H&P Note (Signed)
History and Physical Interval Note:  07/16/2022 11:05 AM  Alejandro Santiago  has presented today for surgery, with the diagnosis of bacteremia.  The various methods of treatment have been discussed with the patient and family. After consideration of risks, benefits and other options for treatment, the patient has consented to  Procedure(s): TRANSESOPHAGEAL ECHOCARDIOGRAM (N/A) as a surgical intervention.  The patient's history has been reviewed, patient examined, no change in status, stable for surgery.  I have reviewed the patient's chart and labs.  Questions were answered to the patient's satisfaction.     Alejandro Santiago

## 2022-07-16 NOTE — Anesthesia Postprocedure Evaluation (Signed)
Anesthesia Post Note  Patient: Alejandro Santiago  Procedure(s) Performed: TRANSESOPHAGEAL ECHOCARDIOGRAM     Patient location during evaluation: PACU Anesthesia Type: MAC Level of consciousness: awake Pain management: pain level controlled Vital Signs Assessment: post-procedure vital signs reviewed and stable Respiratory status: spontaneous breathing, nonlabored ventilation and respiratory function stable Cardiovascular status: stable and blood pressure returned to baseline Postop Assessment: no apparent nausea or vomiting Anesthetic complications: no   No notable events documented.  Last Vitals:  Vitals:   07/16/22 1200 07/16/22 1259  BP: 121/83 122/76  Pulse: 97 95  Resp: (!) 23 18  Temp:  36.6 C  SpO2: 97% 100%    Last Pain:  Vitals:   07/16/22 1259  TempSrc: Oral  PainSc:                  Linton Rump

## 2022-07-16 NOTE — Progress Notes (Signed)
Progress Note   Patient: Alejandro Santiago ZOX:[id number] DOB: Feb 07, 1976 DOA: 07/09/2022     7 DOS: the patient was seen and examined on 07/16/2022   Brief hospital course: 47 y.o. male with no medical history who began to have pain in his "urethra" about 3 days ago. He has dysuria. No hematuria. He has had sweats and chills. Pain is currently in scrotum and anus and is 10/10 right now.  No discharge. He has one male partner over the last 12 years and this is his girlfriend. No h/o STDs.  He is also noted to have elevated glucose levels and is not aware that he is a diabetic.    ED Course:  CT abdomen pelvis-multiloculated rim-enhancing fluid collections within the prostate gland measuring up to 5.7 x 3.3 cm - Circumferential mural thickening and pericystic stranding suggestive of cystitis - Thick-walled hypoenhancing exophytic lesion in the left kidney suspicious for primary renal neoplasm - Left lower lobe groundglass nodule with additional scattered solid nodules in bilateral lower lobes   Sodium 133, chloride 91 WBC 16.8 Hemoglobin A1c 9.9 UA reveals WBC, nitrites, ketones and glucose but no bacteria noted   Cocaine positive Also noted on exam are numerous maculopapular lesions which he states started about 2 weeks ago.  He states that he does not use any IV drugs  Assessment and Plan: Principal Problem:   Prostate abscesses- Sepsis POA - 5/11 I and D  - foley removed 5/17 - HIV non reactive - U culture - MRSA-  blood cultures neg but still suspect secondary to bacteremia -Pt continued on vanc since 5/13 -Urology had been following. Foley cath removed 5/16   Active Problems:  Extensive maculopapular and pustular rash, septic emboli to lung - rash improving - large pustular lesion on face has improved - osler node on right index finger - TTE done without evidence of endocarditis  -ID following. TEE performed 5/17. No endocarditis and neg for PFO with no LAA thrombus noted.  Normal LVEF. Reviewed      Diabetes mellitus type 2 in nonobese (HCC) -Not in DKA - A1c 9.9 - Cont insulin sliding scale, diabetic diet - glycemic trends now better controlled - Continue on 70/30 insulin at 20 units BID given cost and lack of insurance   Rash in groin - continued on ketoconazole    Hyponatremia - He admits to poor oral intake + hyperglycemia over the past couple of days - cont with more aggressive glycemic control per above -sodium has normalized -recheck bmet in AM   Hypokalemia -  replaced    Nicotine abuse -Smokes half a pack a day - Nicotine patch ordered   Cocaine abuse - Counseled this visit - Declined any IV drugs of abuse   Lung nodules and left renal lesion - Will need to follow-up - discussed with patinet  Conjunctivitis - redness and irritation involving R eye associated with crusting this visit - Continue bacitracin-polymyxin eye ointment      Subjective: Seen prior to TEE. Pt without complaints this AM  Physical Exam: Vitals:   07/16/22 1140 07/16/22 1150 07/16/22 1200 07/16/22 1259  BP:  (!) 122/92 121/83 122/76  Pulse: 92 94 97 95  Resp: (!) 25 11 (!) 23 18  Temp:    97.8 F (36.6 C)  TempSrc:    Oral  SpO2: 100% 100% 97% 100%  Weight:      Height:       General exam: Awake, laying in bed, in nad Respiratory system:  Normal respiratory effort, no wheezing Cardiovascular system: regular rate, s1, s2 Gastrointestinal system: Soft, nondistended, positive BS Central nervous system: CN2-12 grossly intact, strength intact Extremities: Perfused, no clubbing Skin: Normal skin turgor, no notable skin lesions seen Psychiatry: Mood normal // no visual hallucinations   Data Reviewed:  Labs reviewed: Na 135, K 4.5, Cr 0.60, Hgb 14.0  Family Communication: Pt in room, family not at bedside  Disposition: Status is: Inpatient Remains inpatient appropriate because: Severity of illness  Planned Discharge Destination:  Home    Author: Rickey Barbara, MD 07/16/2022 3:04 PM  For on call review [email or web address].

## 2022-07-16 NOTE — Progress Notes (Signed)
Pharmacy Antibiotic Note  Alejandro Santiago is a 47 y.o. male admitted on 07/09/2022 with MRSA prostate abscess and concern for endocarditis. Pharmacy has been consulted for Vancomycin dosing.   Vancomycin levels were attempted after the patient's 5/15 PM dose however the peak was drawn late and the trough was drawn after the 5/16 AM dose started infusing.   Repeated levels yesterday evening show a peak/trough of 22/9 mcg/ml respectively for an estimated AUC of 390 which is SUBhtherapeutic. The dose was adjusted yesterday evening however will adjust again slightly this morning for goal AUC 400-550 and keeping VT>10 mcg/ml  Plan: - Adjust Vancomycin to 1250 mg IV every 8 hours (eAUC 486, VT 12.5) - Will continue to follow renal function, culture results, LOT, and antibiotic de-escalation plans   Height: 6' (182.9 cm) Weight: 78.5 kg (173 lb 1 oz) IBW/kg (Calculated) : 77.6  Temp (24hrs), Avg:98.2 F (36.8 C), Min:98.1 F (36.7 C), Max:98.3 F (36.8 C)  Recent Labs  Lab 07/09/22 2053 07/10/22 1235 07/12/22 0736 07/14/22 0453 07/15/22 0456 07/15/22 1036 07/15/22 1417 07/15/22 2051 07/16/22 0508  WBC 17.8* 15.5* 11.2*  --  9.7  --   --   --  9.7  CREATININE 0.74 0.53* 0.48* 0.72 0.69  --   --   --  0.60*  VANCOTROUGH  --   --   --   --   --  17  --  9*  --   VANCOPEAK  --   --   --   --  17*  --  22*  --   --      Estimated Creatinine Clearance: 125.3 mL/min (A) (by C-G formula based on SCr of 0.6 mg/dL (L)).    No Known Allergies  Antimicrobials this admission: Rocephin 5/10 >> 5/12 Flagyl 5/10 >> 5/11 Cipro 5/11 x 1 Vancomycin 5/12 >>  Dose adjustments this admission: 5/16 VP 22 mcg/ml, VP 9 mcg/ml for AUC 390 on 1500 mg/12h >> increase to 1250 mg/8h  Microbiology results: 5/10 UCx >> 100k MRSA 5/10 BCx >> NGf 5/11 Cystoscopy >> 90k MRSA  Thank you for allowing pharmacy to be a part of this patient's care.  Georgina Pillion, PharmD, BCPS Infectious Diseases  Clinical Pharmacist 07/16/2022 7:13 AM   **Pharmacist phone directory can now be found on [email or web address] ([email or web address]).  Listed under [facility].

## 2022-07-16 NOTE — CV Procedure (Signed)
TRANSESOPHAGEAL ECHOCARDIOGRAM (TEE) NOTE  INDICATIONS: infective endocarditis  PROCEDURE:   Informed consent was obtained prior to the procedure. The risks, benefits and alternatives for the procedure were discussed and the patient comprehended these risks.  Risks include, but are not limited to, cough, sore throat, vomiting, nausea, somnolence, esophageal and stomach trauma or perforation, bleeding, low blood pressure, aspiration, pneumonia, infection, trauma to the teeth and death.    After a procedural time-out, the patient was given propofol for sedation by anesthesia. See their separate report.  The patient's heart rate, blood pressure, and oxygen saturation are monitored continuously during the procedure.The oropharynx was anesthetized with topical cetacaine.  The transesophageal probe was inserted in the esophagus and stomach without difficulty and multiple views were obtained.  The patient was kept under observation until the patient left the procedure room.  I was present face-to-face 100% of this time. The patient left the procedure room in stable condition.   Agitated microbubble saline contrast was not administered.  COMPLICATIONS:    There were no immediate complications.  Findings:  LEFT VENTRICLE: The left ventricular wall thickness is normal.  The left ventricular cavity is normal in size. Wall motion is normal.  LVEF is 60-65%.  RIGHT VENTRICLE:  The right ventricle is normal in structure and function without any thrombus or masses.    LEFT ATRIUM:  The left atrium is normal in size without any thrombus or masses.  There is not spontaneous echo contrast ("smoke") in the left atrium consistent with a low flow state.  LEFT ATRIAL APPENDAGE:  The left atrial appendage is free of any thrombus or masses. The appendage has single lobes. Pulse doppler indicates moderate flow in the appendage.  ATRIAL SEPTUM:  The atrial septum appears intact and is free of thrombus and/or  masses.  There is no evidence for interatrial shunting by color doppler and saline microbubble.  RIGHT ATRIUM:  The right atrium is normal in size and function without any thrombus or masses. There is a prominent Eustachian valve.  MITRAL VALVE:  The mitral valve is grossly normal in structure and function with  trivial  regurgitation.  There were no vegetations or stenosis.  AORTIC VALVE:  The aortic valve is trileaflet, normal in structure and function with  no  regurgitation.  There were no vegetations or stenosis  TRICUSPID VALVE:  The tricuspid valve is normal in structure and function with  no  regurgitation.  There were no vegetations or stenosis   PULMONIC VALVE:  The pulmonic valve is normal in structure and function with  no  regurgitation.  There were no vegetations or stenosis.   AORTIC ARCH, ASCENDING AND DESCENDING AORTA:  There was no Myrtis Ser et. Al, 1992) atherosclerosis of the ascending aorta, aortic arch, or proximal descending aorta.  12. PULMONARY VEINS: Anomalous pulmonary venous return was not noted.  13. PERICARDIUM: The pericardium appeared normal and non-thickened.  There is no pericardial effusion.  IMPRESSION:   No endocarditis Negative for PFO No LAA thrombus No significant valve disease LVEF 60-65% with normal wall motion  RECOMMENDATIONS:     Management of MRSA as per ID recommendations.  Time Spent Directly with the Patient:  45 minutes   Chrystie Nose, MD, [facility], FACP  [place]  [facility]  Medical Director of the [facility] Lipid [facility]  Cardiovascular Risk Reduction Clinic Diplomate of the American Board of Clinical Lipidology Attending Cardiologist  Direct Dial: [phone]  Fax: [phone]  Website:  [email or web address]  Lisette Abu Ewell Benassi 07/16/2022, 11:33 AM

## 2022-07-16 NOTE — Anesthesia Procedure Notes (Signed)
Procedure Name: MAC Date/Time: 07/16/2022 11:25 AM  Performed by: Adria Dill, CRNAPre-anesthesia Checklist: Patient identified, Emergency Drugs available, Suction available and Patient being monitored Patient Re-evaluated:Patient Re-evaluated prior to induction Oxygen Delivery Method: Nasal cannula Preoxygenation: Pre-oxygenation with 100% oxygen Induction Type: IV induction Airway Equipment and Method: Bite block Placement Confirmation: positive ETCO2 and breath sounds checked- equal and bilateral Dental Injury: Teeth and Oropharynx as per pre-operative assessment

## 2022-07-16 NOTE — Progress Notes (Signed)
  Echocardiogram Transesophageal has been performed.  Delcie Roch 07/16/2022, 11:49 AM

## 2022-07-17 LAB — COMPREHENSIVE METABOLIC PANEL
ALT: 16 U/L (ref 0–44)
AST: 22 U/L (ref 15–41)
Albumin: 2.8 g/dL — ABNORMAL LOW (ref 3.5–5.0)
Alkaline Phosphatase: 55 U/L (ref 38–126)
Anion gap: 7 (ref 5–15)
BUN: 13 mg/dL (ref 6–20)
CO2: 31 mmol/L (ref 22–32)
Calcium: 8.9 mg/dL (ref 8.9–10.3)
Chloride: 94 mmol/L — ABNORMAL LOW (ref 98–111)
Creatinine, Ser: 0.73 mg/dL (ref 0.61–1.24)
GFR, Estimated: >60 mL/min (ref >60)
Glucose, Bld: 211 mg/dL — ABNORMAL HIGH (ref 70–99)
Potassium: 4.5 mmol/L (ref 3.5–5.1)
Sodium: 132 mmol/L — ABNORMAL LOW (ref 135–145)
Total Bilirubin: 0.4 mg/dL (ref 0.3–1.2)
Total Protein: 6.9 g/dL (ref 6.5–8.1)

## 2022-07-17 LAB — GLUCOSE, CAPILLARY
Glucose-Capillary: 251 mg/dL — ABNORMAL HIGH (ref 70–99)
Glucose-Capillary: 214 mg/dL — ABNORMAL HIGH (ref 70–99)
Glucose-Capillary: 144 mg/dL — ABNORMAL HIGH (ref 70–99)
Glucose-Capillary: 245 mg/dL — ABNORMAL HIGH (ref 70–99)
Glucose-Capillary: 181 mg/dL — ABNORMAL HIGH (ref 70–99)

## 2022-07-17 LAB — CBC
HCT: 40.8 % (ref 39.0–52.0)
Hemoglobin: 13.6 g/dL (ref 13.0–17.0)
MCH: 31.3 pg (ref 26.0–34.0)
MCHC: 33.3 g/dL (ref 30.0–36.0)
MCV: 93.8 fL (ref 80.0–100.0)
Platelets: 414 10*3/uL — ABNORMAL HIGH (ref 150–400)
RBC: 4.35 MIL/uL (ref 4.22–5.81)
RDW: 12.2 % (ref 11.5–15.5)
WBC: 10.2 10*3/uL (ref 4.0–10.5)
nRBC: 0 % (ref 0.0–0.2)

## 2022-07-17 NOTE — Progress Notes (Signed)
Progress Note   Patient: Alejandro Santiago ZOX:[id number] DOB: 1975-10-02 DOA: 07/09/2022     8 DOS: the patient was seen and examined on 07/17/2022   Brief hospital course: 47 y.o. male with no medical history who began to have pain in his "urethra" about 3 days ago. He has dysuria. No hematuria. He has had sweats and chills. Pain is currently in scrotum and anus and is 10/10 right now.  No discharge. He has one male partner over the last 12 years and this is his girlfriend. No h/o STDs.  He is also noted to have elevated glucose levels and is not aware that he is a diabetic.    ED Course:  CT abdomen pelvis-multiloculated rim-enhancing fluid collections within the prostate gland measuring up to 5.7 x 3.3 cm - Circumferential mural thickening and pericystic stranding suggestive of cystitis - Thick-walled hypoenhancing exophytic lesion in the left kidney suspicious for primary renal neoplasm - Left lower lobe groundglass nodule with additional scattered solid nodules in bilateral lower lobes   Sodium 133, chloride 91 WBC 16.8 Hemoglobin A1c 9.9 UA reveals WBC, nitrites, ketones and glucose but no bacteria noted   Cocaine positive Also noted on exam are numerous maculopapular lesions which he states started about 2 weeks ago.  He states that he does not use any IV drugs  Assessment and Plan: Principal Problem:   Prostate abscesses- Sepsis POA - 5/11 I and D  - foley removed 5/17 - HIV non reactive - U culture - MRSA-  blood cultures neg but still suspect secondary to bacteremia -Pt continued on vanc since 5/13. -Urology had been following. Foley cath removed 5/16   Active Problems:  Extensive maculopapular and pustular rash, septic emboli to lung - TTE done without evidence of endocarditis  -ID following. TEE performed 5/17. No endocarditis and neg for PFO with no LAA thrombus noted. Normal LVEF. Reviewed  -Per ID, rec to continue IV vanc for now. Per ID, can consider PO linezolid  vs dalbavancin after 4 weeks of IV abx     Diabetes mellitus type 2 in nonobese (HCC) - A1c 9.9 - Cont insulin sliding scale, diabetic diet - glycemic trends now better controlled - Continue on 70/30 insulin at 20 units BID given cost and lack of insurance   Rash in groin - continued on ketoconazole    Hyponatremia - He admits to poor oral intake + hyperglycemia over the past couple of days - cont with more aggressive glycemic control per above -recommended to pt to limit intake of pure water. Pt did admit to drinking ample amounts of water recently  -recheck bmet in AM   Hypokalemia -  replaced    Nicotine abuse -Smokes half a pack a day - Nicotine patch ordered   Cocaine abuse - Counseled this visit - Declined any IV drugs of abuse   Lung nodules and left renal lesion - Will need to follow-up - discussed with patinet  Conjunctivitis - redness and irritation involving R eye associated with crusting this visit - Continue bacitracin-polymyxin eye ointment - Seems improved today      Subjective: States eye seems improving  Physical Exam: Vitals:   07/17/22 0553 07/17/22 0557 07/17/22 0700 07/17/22 1326  BP: 131/87   (!) 144/90  Pulse: 82   81  Resp: 19   17  Temp: 98 F (36.7 C)   98.4 F (36.9 C)  TempSrc: Oral   Oral  SpO2: 99%   100%  Weight:  81.1 kg  81.1 kg   Height:       General exam: Conversant, in no acute distress, R conjunctival injection seems improved Respiratory system: normal chest rise, clear, no audible wheezing Cardiovascular system: regular rhythm, s1-s2 Gastrointestinal system: Nondistended, nontender, pos BS Central nervous system: No seizures, no tremors Extremities: No cyanosis, no joint deformities Skin: No rashes, no pallor Psychiatry: Affect normal // no auditory hallucinations   Data Reviewed:  Labs reviewed: Na 132, K 4.5, Cr 0.73  Family Communication: Pt in room, family not at bedside  Disposition: Status is:  Inpatient Remains inpatient appropriate because: Severity of illness  Planned Discharge Destination: Home    Author: Rickey Barbara, MD 07/17/2022 2:38 PM  For on call review [email or web address].

## 2022-07-18 DIAGNOSIS — F1410 Cocaine abuse, uncomplicated: Secondary | ICD-10-CM

## 2022-07-18 DIAGNOSIS — B9562 Methicillin resistant Staphylococcus aureus infection as the cause of diseases classified elsewhere: Secondary | ICD-10-CM

## 2022-07-18 DIAGNOSIS — I2690 Septic pulmonary embolism without acute cor pulmonale: Secondary | ICD-10-CM

## 2022-07-18 LAB — COMPREHENSIVE METABOLIC PANEL
ALT: 16 U/L (ref 0–44)
AST: 20 U/L (ref 15–41)
Albumin: 3.1 g/dL — ABNORMAL LOW (ref 3.5–5.0)
Alkaline Phosphatase: 58 U/L (ref 38–126)
Anion gap: 10 (ref 5–15)
BUN: 12 mg/dL (ref 6–20)
CO2: 26 mmol/L (ref 22–32)
Calcium: 8.8 mg/dL — ABNORMAL LOW (ref 8.9–10.3)
Chloride: 96 mmol/L — ABNORMAL LOW (ref 98–111)
Creatinine, Ser: 0.68 mg/dL (ref 0.61–1.24)
GFR, Estimated: >60 mL/min (ref >60)
Glucose, Bld: 245 mg/dL — ABNORMAL HIGH (ref 70–99)
Potassium: 4.1 mmol/L (ref 3.5–5.1)
Sodium: 132 mmol/L — ABNORMAL LOW (ref 135–145)
Total Bilirubin: 0.6 mg/dL (ref 0.3–1.2)
Total Protein: 7.2 g/dL (ref 6.5–8.1)

## 2022-07-18 LAB — CBC
HCT: 40.1 % (ref 39.0–52.0)
Hemoglobin: 13.2 g/dL (ref 13.0–17.0)
MCH: 30.7 pg (ref 26.0–34.0)
MCHC: 32.9 g/dL (ref 30.0–36.0)
MCV: 93.3 fL (ref 80.0–100.0)
Platelets: 416 10*3/uL — ABNORMAL HIGH (ref 150–400)
RBC: 4.3 MIL/uL (ref 4.22–5.81)
RDW: 12.2 % (ref 11.5–15.5)
WBC: 10.3 10*3/uL (ref 4.0–10.5)
nRBC: 0 % (ref 0.0–0.2)

## 2022-07-18 LAB — GLUCOSE, CAPILLARY
Glucose-Capillary: 284 mg/dL — ABNORMAL HIGH (ref 70–99)
Glucose-Capillary: 158 mg/dL — ABNORMAL HIGH (ref 70–99)
Glucose-Capillary: 149 mg/dL — ABNORMAL HIGH (ref 70–99)
Glucose-Capillary: 277 mg/dL — ABNORMAL HIGH (ref 70–99)
Glucose-Capillary: 218 mg/dL — ABNORMAL HIGH (ref 70–99)

## 2022-07-18 LAB — MAGNESIUM: Magnesium: 1.8 mg/dL (ref 1.7–2.4)

## 2022-07-18 MED ORDER — INSULIN ASPART 100 UNIT/ML IJ SOLN: 0.0000 [IU] | Freq: Three times a day (TID) | INTRAMUSCULAR | 0 refills | Status: AC

## 2022-07-18 MED ORDER — NICOTINE 14 MG/24HR TD PT24: 14.0000 mg | MEDICATED_PATCH | TRANSDERMAL | 0 refills | Status: AC

## 2022-07-18 MED ORDER — INSULIN ASPART PROT & ASPART (70-30 MIX) 100 UNIT/ML ~~LOC~~ SUSP: 20.0000 [IU] | Freq: Two times a day (BID) | SUBCUTANEOUS | 11 refills | Status: AC

## 2022-07-18 MED ORDER — ONDANSETRON HCL 4 MG PO TABS: 4.0000 mg | ORAL_TABLET | Freq: Four times a day (QID) | ORAL | 0 refills | Status: AC | PRN

## 2022-07-18 MED ORDER — BISACODYL 5 MG PO TBEC: 5.0000 mg | DELAYED_RELEASE_TABLET | Freq: Every day | ORAL | 0 refills | Status: AC | PRN

## 2022-07-18 MED ORDER — DOCUSATE SODIUM 100 MG PO CAPS: 100.0000 mg | ORAL_CAPSULE | Freq: Two times a day (BID) | ORAL | 0 refills | Status: AC

## 2022-07-18 MED ORDER — POLYMYXIN B-TRIMETHOPRIM 10000-0.1 UNIT/ML-% OP SOLN: 1.0000 [drp] | OPHTHALMIC | 0 refills | Status: AC

## 2022-07-18 MED ORDER — POLYETHYLENE GLYCOL 3350 17 G PO PACK: 17.0000 g | PACK | Freq: Two times a day (BID) | ORAL | 0 refills | Status: AC

## 2022-07-18 MED ORDER — LIP MEDEX EX OINT: TOPICAL_OINTMENT | CUTANEOUS | 0 refills | Status: AC | PRN

## 2022-07-18 MED ORDER — VANCOMYCIN HCL 1250 MG/250ML IV SOLN: 1250.0000 mg | Freq: Three times a day (TID) | INTRAVENOUS | Status: AC

## 2022-07-18 MED ORDER — SODIUM CHLORIDE 0.9 % IV SOLN
1.0000 g | Freq: Once | INTRAVENOUS | Status: AC
  Administered 2022-07-18: 1 g via INTRAVENOUS
  Filled 2022-07-18: qty 10

## 2022-07-18 MED ORDER — NYSTATIN-TRIAMCINOLONE 100000-0.1 UNIT/GM-% EX CREA: TOPICAL_CREAM | Freq: Two times a day (BID) | CUTANEOUS | 0 refills | Status: AC

## 2022-07-18 MED ORDER — POLYMYXIN B-TRIMETHOPRIM 10000-0.1 UNIT/ML-% OP SOLN
1.0000 [drp] | OPHTHALMIC | Status: DC
  Administered 2022-07-18 (×3): 1 [drp] via OPHTHALMIC
  Filled 2022-07-18: qty 10

## 2022-07-18 MED ORDER — DOXYCYCLINE HYCLATE 100 MG PO TABS
100.0000 mg | ORAL_TABLET | Freq: Two times a day (BID) | ORAL | Status: DC
  Administered 2022-07-18: 100 mg via ORAL
  Filled 2022-07-18: qty 1

## 2022-07-18 MED ORDER — ZOLPIDEM TARTRATE 5 MG PO TABS
2.5000 mg | ORAL_TABLET | Freq: Once | ORAL | Status: AC | PRN
  Administered 2022-07-18: 2.5 mg via ORAL
  Filled 2022-07-18: qty 1

## 2022-07-18 MED ORDER — SENNA 8.6 MG PO TABS: 1.0000 | ORAL_TABLET | Freq: Every day | ORAL | 0 refills | Status: AC

## 2022-07-18 NOTE — Progress Notes (Signed)
Progress Note   Patient: Alejandro Santiago ZOX:[id number] DOB: 17-Mar-1975 DOA: 07/09/2022     9 DOS: the patient was seen and examined on 07/18/2022   Brief hospital course: 47 y.o. male with no medical history who began to have pain in his "urethra" about 3 days ago. He has dysuria. No hematuria. He has had sweats and chills. Pain is currently in scrotum and anus and is 10/10 right now.  No discharge. He has one male partner over the last 12 years and this is his girlfriend. No h/o STDs.  He is also noted to have elevated glucose levels and is not aware that he is a diabetic.    ED Course:  CT abdomen pelvis-multiloculated rim-enhancing fluid collections within the prostate gland measuring up to 5.7 x 3.3 cm - Circumferential mural thickening and pericystic stranding suggestive of cystitis - Thick-walled hypoenhancing exophytic lesion in the left kidney suspicious for primary renal neoplasm - Left lower lobe groundglass nodule with additional scattered solid nodules in bilateral lower lobes   Sodium 133, chloride 91 WBC 16.8 Hemoglobin A1c 9.9 UA reveals WBC, nitrites, ketones and glucose but no bacteria noted   Cocaine positive Also noted on exam are numerous maculopapular lesions which he states started about 2 weeks ago.  He states that he does not use any IV drugs  Assessment and Plan: Principal Problem:   Prostate abscesses- Sepsis POA - 5/11 I and D  - foley removed 5/17 - HIV non reactive - U culture - MRSA-  blood cultures neg but still suspect secondary to bacteremia -Pt continued on vanc since 5/13. -Urology had been following. Foley cath removed 5/16   Active Problems:  Extensive maculopapular and pustular rash, septic emboli to lung - TTE done without evidence of endocarditis  -ID following. TEE performed 5/17. No endocarditis and neg for PFO with no LAA thrombus noted. Normal LVEF. Reviewed  -Per ID, rec to continue IV vanc for now. Per ID, can consider PO linezolid  vs dalbavancin after 4 weeks of IV abx     Diabetes mellitus type 2 in nonobese (HCC) - A1c 9.9 - Cont insulin sliding scale, diabetic diet - glycemic trends now better controlled - Continue on 70/30 insulin at 20 units BID given cost and lack of insurance   Rash in groin - continued on ketoconazole    Hyponatremia - He admits to poor oral intake + hyperglycemia over the past couple of days - cont with more aggressive glycemic control per above -pt noted to have ample amounts of drinks at bedside - Will place on 1500cc fluid restriction -Recheck bmet in AM   Hypokalemia -  normalized    Nicotine abuse -Smokes half a pack a day - Nicotine patch ordered   Cocaine abuse - Counseled this visit - Declined any IV drugs of abuse   Lung nodules and left renal lesion - Will need to follow-up - discussed with patinet  Conjunctivitis - redness and irritation involving R eye associated with crusting this visit - pt was initially continued on bacitracin-polymyxin eye ointment - Initially thought to be improved, however, pt continues with redness. Also reports eye irritation with bright light and ?vision change -Appreciate ID input. GC/Chlamydia w/u in progress. Ordered empiric doxy x7 days and rocephin x 1 per recs -Have reached out to Ophthalmology as well, greatly appreciate assistance      Subjective: Reports still having irritated R eye, worse with bright light. Also possible vision change  Physical Exam: Vitals:  07/17/22 1326 07/18/22 0300 07/18/22 0449 07/18/22 1254  BP: (!) 144/90 127/80 130/70 (!) 149/91  Pulse: 81 79 81 85  Resp: 17 18 18 17   Temp: 98.4 F (36.9 C) 98.9 F (37.2 C) 97.8 F (36.6 C) 97.9 F (36.6 C)  TempSrc: Oral Oral Oral Oral  SpO2: 100%   100%  Weight:   81.4 kg   Height:       General exam: Awake, laying in bed, in nad Respiratory system: Normal respiratory effort, no wheezing Cardiovascular system: regular rate, s1, s2 Gastrointestinal  system: Soft, nondistended, positive BS Central nervous system: CN2-12 grossly intact, strength intact Extremities: Perfused, no clubbing Skin: Normal skin turgor, no notable skin lesions seen Psychiatry: Mood normal // no visual hallucinations   Data Reviewed:  Labs reviewed: Na 132, K 4.1, Cr 0.68, WBC 10.3, Hgb 13.2  Family Communication: Pt in room, family not at bedside  Disposition: Status is: Inpatient Remains inpatient appropriate because: Severity of illness  Planned Discharge Destination: Home    Author: Rickey Barbara, MD 07/18/2022 3:16 PM  For on call review [email or web address].

## 2022-07-18 NOTE — Progress Notes (Addendum)
       Overnight   NAME: Alejandro Santiago MRN: [id number] DOB : 1975-06-19    Date of Service   07/18/2022   HPI/Events of Note    Patient has received a bed at [facility] for specialty care as noted by Attending.   Patient is in no obvious or stated distress and is awaiting transport currently (2238 hrs.)    Interventions/ Plan   Transport is coordinated  Report completed  1 hour reassessment completed       Chinita Greenland BSN MSNA MSN ACNPC-AG Acute Care Nurse Practitioner [facility]

## 2022-07-18 NOTE — Progress Notes (Addendum)
[facility] Infectious Diseases Follow Up Note  Patient Identification: Patient Name: Alejandro Santiago MRN: [id number] Admit Date: 07/09/2022 11:01 AM Age: 47 y.o.Today's Date: 07/18/2022   Reason for Visit: MRSA infection   Principal Problem:   Prostate abscess Active Problems:   Diabetes mellitus type 2 in nonobese [facility])  Antibiotics:  Vancomycin 5/12- Ceftriaxone 5/10-5/12 Ciprofloxacin 5/11 Metronidazole 5/10   Lines/Hardware: No known   Interval Events: Remains afebrile TEE today  Eye drops started for rt pink eye    Assessment 6-year-old male with PMH of hypertension, DM2, cocaine abuse who presented with multiple complaints. Admitted with    # Prostatic abscess/cystitis  # septic pulm emboli # mrsa disseminated infection 5/10 urine cx greater than 100,000 MRSA S/p TURP on 5/11. OR with purulence noted. Cx MRSA  Foley removed 5/16 voiding without difficulty  Doing well on vancomycin 5/17 tee negative    # Multiple pustular lesions( rt cheek, rt eyebrow, rt lateral neck, rt lateral scalp, webspace between left thumb and index finger, left distal forearm, few in the bilateral legs) - likely all MRSA related, improving on abtx   # Rt eye conjunctivitis - unlikely preseptal or orbital cellulitis - on eye drops  No improvement after several days No sign of visual loss Blood cx negative and I do not suspect endophthalmitis ?gonorrhea/chlamydial infection/conjuncitivitis ?block nasolacrimal duct  Would try warm compress  Switch bacitracin/polymixin to trim/polymixin as some times bacitracin can cause allergic reaction Urine gc/chlamydia and conj swab for gc/chlamydia pcr testing However, given the rather nonpurulent and minimal discharge, and decrease vision in setting mrsa dissemination - would recommend urgent ophthamologic evaluation r/o endophthalmitis Will add doxycycline 100 mg po bid and ceftriaxone 1 gram  iv once after sample obtained   #  Left renal lesion suspicious for hypoenhancing primary renal neoplasm - non emergent renal mass protocol MRI or CT per Urology guidance    # Polysubstance use  # Groin fungal rash - Ketoconazole cream per primary    Recommendations Continue Vancomycin, pharmacy to dose  Planned 6 weeks antibiotics, at least 2-4 weeks iv then oral tail For conjunctival process A!) please consult ophthalmology for decreased vision/mrsa dissemination concern for endopthalmitis A) swab eye and get urine sample for gc/chlam pcr B) warm compress C) switch bacitracin/poly to trim/poly  D) after sample obtained as above, start doxycycline 100 mg po bid x7 days and 1 dose of ceftriaxone 1 gram iv Discussed with primary team      ______________________________________________________________________ Subjective Continued right eye conjunctivitis change despite on abx eye drop for several days  No f/c Decreased vision last few days with also photophobia Pain/discomfort in back of right eye   Vitals BP 130/70 (BP Location: Right Arm)   Pulse 81   Temp 97.8 F (36.6 C) (Oral)   Resp 18   Ht 6' (1.829 m)   Wt 81.4 kg   SpO2 100%   BMI 24.34 kg/m     Physical Exam Constitutional:  adult male lying in the bed who just woke up     Comments: right conj injection without purulence; no obvious corneal changes; can't read out of right eye letters/words; photophobia   Cardiovascular:     Rate and Rhythm: Normal rate and regular rhythm.     Heart sounds:  Pulmonary:     Effort: Pulmonary effort is normal.     Comments:   Abdominal:     Palpations: Abdomen is soft.     Tenderness:   Musculoskeletal:  General: No swelling or tenderness in peripheral joints   Skin:    Comments: multiple skin bumps in different parts of the body as listed above- all improving on abtx   Neurological:     General: awake, alert and oriented, grossly non focal    Psychiatric:        Mood and Affect: Mood normal.   Pertinent Microbiology Results for orders placed or performed during the hospital encounter of 07/09/22  Urine Culture     Status: Abnormal   Collection Time: 07/09/22 12:38 PM   Specimen: Urine, Clean Catch  Result Value Ref Range Status   Specimen Description   Final    URINE, CLEAN CATCH Performed at [facility], [address], [place], [place] [address]    Special Requests   Final    NONE Performed at [facility], [address] [place], [place] [address]    Culture (A)  Final    >=100,000 COLONIES/mL METHICILLIN RESISTANT STAPHYLOCOCCUS AUREUS   Report Status 07/11/2022 FINAL  Final   Organism ID, Bacteria METHICILLIN RESISTANT STAPHYLOCOCCUS AUREUS (A)  Final      Susceptibility   Methicillin resistant staphylococcus aureus - MIC*    CIPROFLOXACIN >=8 RESISTANT Resistant     GENTAMICIN <=0.5 SENSITIVE Sensitive     NITROFURANTOIN <=16 SENSITIVE Sensitive     OXACILLIN >=4 RESISTANT Resistant     TETRACYCLINE <=1 SENSITIVE Sensitive     VANCOMYCIN <=0.5 SENSITIVE Sensitive     TRIMETH/SULFA >=320 RESISTANT Resistant     CLINDAMYCIN <=0.25 SENSITIVE Sensitive     RIFAMPIN <=0.5 SENSITIVE Sensitive     Inducible Clindamycin NEGATIVE Sensitive     LINEZOLID 2 SENSITIVE Sensitive     * >=100,000 COLONIES/mL METHICILLIN RESISTANT STAPHYLOCOCCUS AUREUS  Blood culture (routine x 2)     Status: None   Collection Time: 07/09/22  8:53 PM   Specimen: BLOOD  Result Value Ref Range Status   Specimen Description   Final    BLOOD BLOOD RIGHT ARM Performed at [facility], [address] [place], [place] [address]    Special Requests   Final    BOTTLES DRAWN AEROBIC ONLY Blood Culture adequate volume Performed at [facility], [address], [place], [place] [address]    Culture   Final    NO GROWTH 5 DAYS Performed at [facility],  [address], [place], [place] [address]    Report Status 07/15/2022 FINAL  Final  Blood culture (routine x 2)     Status: None   Collection Time: 07/09/22  8:58 PM   Specimen: BLOOD  Result Value Ref Range Status   Specimen Description   Final    BLOOD BLOOD LEFT ARM Performed at [facility], [address], [place], [place] [address]    Special Requests   Final    BOTTLES DRAWN AEROBIC ONLY Blood Culture adequate volume Performed at [facility], [address], [place], [place] [address]    Culture   Final    NO GROWTH 5 DAYS Performed at [facility], [address], [place], [place] [address]    Report Status 07/15/2022 FINAL  Final  Urine Culture     Status: Abnormal   Collection Time: 07/10/22 10:01 AM   Specimen: Urine, Cystoscope  Result Value Ref Range Status   Specimen Description   Final    CYSTOSCOPY Performed at [facility],  [address], [place], [place] [address]    Special Requests   Final    NONE Performed at [facility], [address], [place], [place] [address]    Culture (A)  Final    90,000 COLONIES/mL METHICILLIN RESISTANT STAPHYLOCOCCUS AUREUS   Report Status 07/12/2022 FINAL  Final   Organism ID, Bacteria METHICILLIN RESISTANT STAPHYLOCOCCUS AUREUS (A)  Final      Susceptibility   Methicillin resistant staphylococcus aureus - MIC*    CIPROFLOXACIN >=8 RESISTANT Resistant     ERYTHROMYCIN <=0.25 SENSITIVE Sensitive     GENTAMICIN <=0.5 SENSITIVE Sensitive     OXACILLIN >=4 RESISTANT Resistant     TETRACYCLINE <=1 SENSITIVE Sensitive     VANCOMYCIN <=0.5 SENSITIVE Sensitive     TRIMETH/SULFA >=320 RESISTANT Resistant     CLINDAMYCIN <=0.25 SENSITIVE Sensitive     RIFAMPIN <=0.5 SENSITIVE Sensitive     Inducible Clindamycin NEGATIVE Sensitive     LINEZOLID 2 SENSITIVE Sensitive     * 90,000 COLONIES/mL METHICILLIN RESISTANT STAPHYLOCOCCUS AUREUS   Pertinent  Lab.    Latest Ref Rng & Units 07/18/2022    4:23 AM 07/17/2022    5:48 AM 07/16/2022    5:08 AM  CBC  WBC 4.0 - 10.5 K/uL 10.3  10.2  9.7   Hemoglobin 13.0 - 17.0 g/dL 72.5  36.6  44.0   Hematocrit 39.0 - 52.0 % 40.1  40.8  41.5   Platelets 150 - 400 K/uL 416  414  453       Latest Ref Rng & Units 07/18/2022    4:23 AM 07/17/2022    5:48 AM 07/16/2022    5:08 AM  CMP  Glucose 70 - 99 mg/dL 347  425  956   BUN 6 - 20 mg/dL 12  13  13    Creatinine 0.61 - 1.24 mg/dL 3.87  5.64  3.32   Sodium 135 - 145 mmol/L 132  132  135   Potassium 3.5 - 5.1 mmol/L 4.1  4.5  4.5   Chloride 98 - 111 mmol/L 96  94  96   CO2 22 - 32 mmol/L 26  31  32   Calcium 8.9 - 10.3 mg/dL 8.8  8.9  9.2   Total Protein 6.5 - 8.1 g/dL 7.2  6.9  7.0   Total Bilirubin 0.3 - 1.2 mg/dL 0.6  0.4  0.6   Alkaline Phos 38 - 126 U/L 58  55  54   AST 15 - 41 U/L 20  22  18    ALT 0 - 44 U/L 16  16  13       Pertinent Imaging today Plain films and CT images have been personally visualized and interpreted; radiology reports have been reviewed. Decision making incorporated into the Impression /   No results found.  I spent more than 50 minute reviewing data/chart, and coordinating care, providing direct face to face time providing counseling/discussing diagnostics/treatment plan with patient and treatment team         Raymondo Band, MD [facility] for Infectious Disease [facility] [phone]  pager   [phone] cell 07/18/2022, 11:18 AM

## 2022-07-18 NOTE — Plan of Care (Signed)

## 2022-07-18 NOTE — Discharge Summary (Signed)
Physician Discharge Summary   Patient: Alejandro Santiago MRN: [id number] DOB: [birth year]  Admit date:     07/09/2022  Discharge date: 07/18/22  Discharge Physician: Rickey Barbara   PCP: Patient, No Pcp Per   Recommendations at discharge:   Transfer to tertiary care center. Follow up PCP when ultimately discharged  Discharge Diagnoses: Principal Problem:   Prostate abscess Active Problems:   Diabetes mellitus type 2 in nonobese [facility])  Resolved Problems:   * No resolved hospital problems. *  Hospital Course: 47 y.o. male with no medical history who began to have pain in his "urethra" about 3 days prior to admit. He reported dysuria. No hematuria. He had sweats and chills. Pain was noted in the scrotum and anus without discharge. Patient was noted to have elevated glucose levels and was not aware that he is a diabetic.    ED Course:  CT abdomen pelvis-multiloculated rim-enhancing fluid collections within the prostate gland measuring up to 5.7 x 3.3 cm - Circumferential mural thickening and pericystic stranding suggestive of cystitis - Thick-walled hypoenhancing exophytic lesion in the left kidney suspicious for primary renal neoplasm - Left lower lobe groundglass nodule with additional scattered solid nodules in bilateral lower lobes   Patient was noted to be cocaine positive. Also noted on exam were numerous maculopapular lesions which he states started about 2 weeks prior to admit.  He states that he does not use any IV drugs  Assessment and Plan:  Prostate abscesses- Sepsis POA -Urology was consulted - Patient underwent I&D on 5/11 - HIV non reactive - Urine culture notable for MRSA - blood cultures are negative -Pt has been continued on vanc since 5/13. -Foley cath removed 5/16 with Urology now following peripherally   Active Problems:  Extensive maculopapular and pustular rash, septic emboli to lung -ID had been following - TTE was done on 5/14 without evidence of  endocarditis  -Given concerns of endocarditis, a f/u TEE was later performed on 5/17. Findings of no endocarditis, neg for PFO, no LAA thrombus. Normal LVEF. -Per ID, despite negative blood cultures and negative TEE, clinical suspicion for endocarditis remains high given septic emboli, diffuse MRSA infections, and hx of drug abuse -ID had subsequently recommended continuing total 4 week course of IV vanc before considering changing to PO linezolid vs dalbavancin to complete course. Vancomycin was started 5/12    Diabetes mellitus type 2 in nonobese (HCC) - A1c 9.9 - Patient had been continued on insulin sliding scale with diabetic diet - glycemic trends now better controlled - Continue on 70/30 insulin at 20 units BID given cost and lack of insurance   Rash in groin - continued on ketoconazole    Hyponatremia -pt noted to have ample amounts of drinks at bedside -Urine Na of 61 with urine osm 333 and serum osm of 286 -Placed on 1500cc fluid restriction   Hypokalemia -  normalized    Nicotine abuse -Smokes half a pack a day - Nicotine patch ordered   Cocaine abuse - Counseled this visit - Declined any IV drugs of abuse   Lung nodules and left renal lesion - Will need to follow-up - discussed with patinet   Conjunctivitis ruled out, Chorioretinitis with early vitritis OS ruled in - Noted to have redness and irritation involving R eye  - pt was initially continued on a trial of bacitracin-polymyxin eye ointment - Initially thought to be improved, however, pt continued with redness. Also reported eye irritation worse with bright light and ?vision change -  Appreciate input by Ophthalmology. 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 -Called [facility] on 5/19 who declined citing bed capacity -Called [facility] 5/19 who graciously accepted this patient in  transfer       Consultants: Urology, ID, Cardiology Procedures performed: TEE, TTE, I/D of prostate abscess  Disposition:  Transfer to [facility] Care center Diet recommendation:  Carb modified diet DISCHARGE MEDICATION: Allergies as of 07/18/2022   No Known Allergies      Medication List     STOP taking these medications    benzonatate 100 MG capsule Commonly known as: Tessalon Perles   clindamycin 300 MG capsule Commonly known as: CLEOCIN   ibuprofen 200 MG tablet Commonly known as: ADVIL   lidocaine 5 % Commonly known as: Lidoderm   methocarbamol 500 MG tablet Commonly known as: ROBAXIN   naproxen 500 MG tablet Commonly known as: NAPROSYN       TAKE these medications    bisacodyl 5 MG EC tablet Commonly known as: DULCOLAX Take 1 tablet (5 mg total) by mouth daily as needed for moderate constipation.   docusate sodium 100 MG capsule Commonly known as: COLACE Take 1 capsule (100 mg total) by mouth 2 (two) times daily.   HYDROcodone-acetaminophen 5-325 MG tablet Commonly known as: Norco Take 1 tablet by mouth every 4 (four) hours as needed for moderate pain.   insulin aspart 100 UNIT/ML injection Commonly known as: novoLOG Inject 0-15 Units into the skin 3 (three) times daily with meals. Start taking on: Jul 19, 2022   insulin aspart protamine- aspart (70-30) 100 UNIT/ML injection Commonly known as: NOVOLOG MIX 70/30 Inject 0.2 mLs (20 Units total) into the skin 2 (two) times daily with a meal. Start taking on: Jul 19, 2022   lip balm ointment Apply topically as needed for lip care.   nicotine 14 mg/24hr patch Commonly known as: NICODERM CQ - dosed in mg/24 hours Place 1 patch (14 mg total) onto the skin daily. Start taking on: Jul 19, 2022   nystatin-triamcinolone cream Commonly known as: MYCOLOG II Apply topically 2 (two) times daily.   ondansetron 4 MG tablet Commonly known as: ZOFRAN Take 1 tablet (4 mg total) by mouth every 6 (six)  hours as needed for nausea.   polyethylene glycol 17 g packet Commonly known as: MIRALAX / GLYCOLAX Take 17 g by mouth 2 (two) times daily.   senna 8.6 MG Tabs tablet Commonly known as: SENOKOT Take 1 tablet (8.6 mg total) by mouth daily. Start taking on: Jul 19, 2022   trimethoprim-polymyxin b ophthalmic solution Commonly known as: POLYTRIM Place 1 drop into the right eye every 4 (four) hours.   vancomycin 1250 MG/250ML Soln Commonly known as: VANCOREADY Inject 250 mLs (1,250 mg total) into the vein every 8 (eight) hours. Start taking on: Jul 19, 2022        Follow-up Information     [facility] Follow up on 07/19/2022.   Specialty: Internal Medicine Why: Time 0920, Please arrive 15 min early to complete paperwork Contact information: [address] [place] [address] [phone]        Jerilee Field, MD. Call.   Specialty: Urology Why: Be sure to follow-up for surveillance of the complex left renal cyst/mass. Contact information: [address] [place] [address] [phone]  Discharge Exam: Filed Weights   07/17/22 0557 07/17/22 0700 07/18/22 0449  Weight: 81.1 kg 81.1 kg 81.4 kg   General exam: Awake, laying in bed, in nad, conjunctiva red Respiratory system: Normal respiratory effort, no wheezing Cardiovascular system: regular rate, s1, s2 Gastrointestinal system: Soft, nondistended, positive BS Central nervous system: CN2-12 grossly intact, strength intact Extremities: Perfused, no clubbing Skin: Normal skin turgor, no notable skin lesions seen Psychiatry: Mood normal // no visual hallucinations   Condition at discharge: fair  The results of significant diagnostics from this hospitalization (including imaging, microbiology, ancillary and laboratory) are listed below for reference.   Imaging Studies: ECHO TEE  Result Date: 07/16/2022    TRANSESOPHOGEAL ECHO REPORT   Patient Name:    NEWEL CLEERE Date of Exam: 07/16/2022 Medical Rec #:  [id number]       Height:       72.0 in Accession #:    [id number]      Weight:       173.1 lb Date of Birth:  07-10-1975        BSA:          2.004 m Patient Age:    46 years        BP:           114/73 mmHg Patient Gender: M               HR:           86 bpm. Exam Location:  Inpatient Procedure: Transesophageal Echo, Color Doppler and Cardiac Doppler Indications:     endocarditis  History:         Patient has prior history of Echocardiogram examinations, most                  recent 07/13/2022. Risk Factors:Diabetes and Current Smoker.  Sonographer:     Delcie Roch RDCS Referring Phys:  [id number] Corrin Parker Diagnosing Phys: Zoila Shutter MD PROCEDURE: After discussion of the risks and benefits of a TEE, an informed consent was obtained from the patient. The transesophogeal probe was passed without difficulty through the esophogus of the patient. Imaged were obtained with the patient in a supine position. Sedation performed by different physician. The patient was monitored while under deep sedation. Anesthestetic sedation was provided intravenously by Anesthesiology: 248mg  of Propofol, 100mg  of Lidocaine. The patient developed no complications during the procedure.  IMPRESSIONS  1. Left ventricular ejection fraction, by estimation, is 60 to 65%. The left ventricle has normal function. The left ventricle has no regional wall motion abnormalities.  2. Right ventricular systolic function is normal. The right ventricular size is normal.  3. No left atrial/left atrial appendage thrombus was detected.  4. The mitral valve is grossly normal. Trivial mitral valve regurgitation.  5. The aortic valve is tricuspid. Aortic valve regurgitation is not visualized. Conclusion(s)/Recommendation(s): No evidence of vegetation/infective endocarditis on this transesophageael echocardiogram. FINDINGS  Left Ventricle: Left ventricular ejection fraction, by estimation, is 60  to 65%. The left ventricle has normal function. The left ventricle has no regional wall motion abnormalities. The left ventricular internal cavity size was normal in size. There is  no left ventricular hypertrophy. Right Ventricle: The right ventricular size is normal. No increase in right ventricular wall thickness. Right ventricular systolic function is normal. Left Atrium: Left atrial size was normal in size. No left atrial/left atrial appendage thrombus was detected. Right Atrium: Right atrial size was normal in size. Prominent Eustachian valve. Pericardium: There is  no evidence of pericardial effusion. Mitral Valve: The mitral valve is grossly normal. Trivial mitral valve regurgitation. Tricuspid Valve: The tricuspid valve is normal in structure. Tricuspid valve regurgitation is not demonstrated. Aortic Valve: The aortic valve is tricuspid. Aortic valve regurgitation is not visualized. Pulmonic Valve: The pulmonic valve was normal in structure. Pulmonic valve regurgitation is not visualized. Aorta: The aortic root and ascending aorta are structurally normal, with no evidence of dilitation. IAS/Shunts: No atrial level shunt detected by color flow Doppler. Zoila Shutter MD Electronically signed by Zoila Shutter MD Signature Date/Time: 07/16/2022/11:51:36 AM    Final    EP STUDY  Result Date: 07/16/2022 See surgical note for result.  ECHOCARDIOGRAM COMPLETE  Result Date: 07/13/2022    ECHOCARDIOGRAM REPORT   Patient Name:   JAMARLON HENSLER Date of Exam: 07/13/2022 Medical Rec #:  [id number]       Height:       72.0 in Accession #:    [id number]      Weight:       160.0 lb Date of Birth:  05/04/1975        BSA:          1.938 m Patient Age:    46 years        BP:           134/83 mmHg Patient Gender: M               HR:           76 bpm. Exam Location:  Inpatient Procedure: 2D Echo, Cardiac Doppler and Color Doppler Indications:    Abscess  History:        Patient has no prior history of Echocardiogram  examinations.                 Prostate abscess; Risk Factors:Diabetes, Current Smoker and                 Hypertension.  Sonographer:    Wallie Char Referring Phys: [id number] [facility] IMPRESSIONS  1. Left ventricular ejection fraction, by estimation, is 60 to 65%. The left ventricle has normal function. The left ventricle has no regional wall motion abnormalities. Left ventricular diastolic parameters were normal.  2. Right ventricular systolic function is normal. The right ventricular size is normal.  3. The mitral valve is normal in structure. Trivial mitral valve regurgitation. No evidence of mitral stenosis.  4. The aortic valve is normal in structure. Aortic valve regurgitation is not visualized. No aortic stenosis is present.  5. The inferior vena cava is normal in size with greater than 50% respiratory variability, suggesting right atrial pressure of 3 mmHg. Conclusion(s)/Recommendation(s): No evidence of valvular vegetations on this transthoracic echocardiogram. Consider a transesophageal echocardiogram to exclude infective endocarditis if clinically indicated. FINDINGS  Left Ventricle: Left ventricular ejection fraction, by estimation, is 60 to 65%. The left ventricle has normal function. The left ventricle has no regional wall motion abnormalities. The left ventricular internal cavity size was normal in size. There is  no left ventricular hypertrophy. Left ventricular diastolic parameters were normal. Right Ventricle: The right ventricular size is normal. No increase in right ventricular wall thickness. Right ventricular systolic function is normal. Left Atrium: Left atrial size was normal in size. Right Atrium: Right atrial size was normal in size. Pericardium: There is no evidence of pericardial effusion. Mitral Valve: The mitral valve is normal in structure. Trivial mitral valve regurgitation. No evidence of mitral valve stenosis. MV peak gradient, 4.1  mmHg. The mean mitral valve gradient is  2.0 mmHg. Tricuspid Valve: The tricuspid valve is normal in structure. Tricuspid valve regurgitation is trivial. No evidence of tricuspid stenosis. Aortic Valve: The aortic valve is normal in structure. Aortic valve regurgitation is not visualized. No aortic stenosis is present. Aortic valve mean gradient measures 5.0 mmHg. Aortic valve peak gradient measures 9.1 mmHg. Aortic valve area, by VTI measures 2.65 cm. Pulmonic Valve: The pulmonic valve was not well visualized. Pulmonic valve regurgitation is not visualized. No evidence of pulmonic stenosis. Aorta: The aortic root is normal in size and structure. Venous: The inferior vena cava is normal in size with greater than 50% respiratory variability, suggesting right atrial pressure of 3 mmHg. IAS/Shunts: No atrial level shunt detected by color flow Doppler.  LEFT VENTRICLE PLAX 2D LVIDd:         4.80 cm     Diastology LVIDs:         3.10 cm     LV e' medial:    9.82 cm/s LV PW:         1.00 cm     LV E/e' medial:  8.2 LV IVS:        0.80 cm     LV e' lateral:   11.70 cm/s LVOT diam:     1.90 cm     LV E/e' lateral: 6.9 LV SV:         76 LV SV Index:   39 LVOT Area:     2.84 cm  LV Volumes (MOD) LV vol d, MOD A2C: 64.7 ml LV vol d, MOD A4C: 87.9 ml LV vol s, MOD A2C: 21.7 ml LV vol s, MOD A4C: 28.8 ml LV SV MOD A2C:     43.0 ml LV SV MOD A4C:     87.9 ml LV SV MOD BP:      52.0 ml RIGHT VENTRICLE             IVC RV Basal diam:  3.40 cm     IVC diam: 1.80 cm RV S prime:     18.50 cm/s TAPSE (M-mode): 2.7 cm LEFT ATRIUM             Index        RIGHT ATRIUM           Index LA diam:        4.10 cm 2.12 cm/m   RA Area:     15.80 cm LA Vol (A2C):   53.7 ml 27.71 ml/m  RA Volume:   40.30 ml  20.80 ml/m LA Vol (A4C):   41.9 ml 21.62 ml/m LA Biplane Vol: 49.3 ml 25.44 ml/m  AORTIC VALVE AV Area (Vmax):    2.54 cm AV Area (Vmean):   2.51 cm AV Area (VTI):     2.65 cm AV Vmax:           150.50 cm/s AV Vmean:          106.150 cm/s AV VTI:            0.286 m AV Peak  Grad:      9.1 mmHg AV Mean Grad:      5.0 mmHg LVOT Vmax:         135.00 cm/s LVOT Vmean:        94.100 cm/s LVOT VTI:          0.267 m LVOT/AV VTI ratio: 0.94  AORTA Ao Root diam: 3.40 cm Ao Asc diam:  2.80 cm MITRAL VALVE               TRICUSPID VALVE MV Area (PHT): 3.08 cm    TR Peak grad:   26.6 mmHg MV Area VTI:   2.68 cm    TR Vmax:        258.00 cm/s MV Peak grad:  4.1 mmHg MV Mean grad:  2.0 mmHg    SHUNTS MV Vmax:       1.01 m/s    Systemic VTI:  0.27 m MV Vmean:      59.7 cm/s   Systemic Diam: 1.90 cm MV Decel Time: 246 msec MV E velocity: 80.40 cm/s MV A velocity: 80.80 cm/s MV E/A ratio:  1.00 Kardie Tobb DO Electronically signed by Thomasene Ripple DO Signature Date/Time: 07/13/2022/12:35:19 PM    Final    CT CHEST W CONTRAST  Result Date: 07/12/2022 CLINICAL DATA:  Evaluation of septic pulmonary emboli in the setting of MRSA prostatic abscesses EXAM: CT CHEST WITH CONTRAST TECHNIQUE: Multidetector CT imaging of the chest was performed during intravenous contrast administration. RADIATION DOSE REDUCTION: This exam was performed according to the departmental dose-optimization program which includes automated exposure control, adjustment of the mA and/or kV according to patient size and/or use of iterative reconstruction technique. CONTRAST:  75mL OMNIPAQUE IOHEXOL 300 MG/ML  SOLN COMPARISON:  Chest radiograph dated 07/09/2022 FINDINGS: Cardiovascular: Normal heart size. No significant pericardial fluid/thickening. Great vessels are normal in course and caliber. No central pulmonary emboli. Coronary artery calcifications. Mediastinum/Nodes: Imaged thyroid gland without nodules meeting criteria for imaging follow-up by size. Normal esophagus. No pathologically enlarged axillary, supraclavicular, mediastinal, or hilar lymph nodes. Lungs/Pleura: The central airways are patent. Diffuse right upper lobe tree-in-bud nodules and dependent left lower lobe with additional scattered nodules measuring up to 6 x 4  mm in the right lower lobe (6:92) 1.8 x 0.7 cm in the left lower lobe (6:102). 5 x 5 mm perifissural right middle lobe nodule (6: 86) is most often an intrapulmonary lymph node. Lateral interlobular septal thickening. No pneumothorax. Trace bilateral pleural effusions. Upper abdomen: Similar splenomegaly measures 15.2 cm. Musculoskeletal: No acute or abnormal lytic or blastic osseous lesions. IMPRESSION: 1. Diffuse right upper lobe tree-in-bud nodules and scattered nodularity, likely infectious/inflammatory. Dependent left lower lobe nodules measuring up to 1.8 x 0.7 cm are in a distribution typical of septic pulmonary emboli without current necrosis. Non-contrast chest CT at 3-6 months is recommended. If the nodules are stable at time of repeat CT, then future CT at 18-24 months (from today's scan) is considered optional for low-risk patients, but is recommended for high-risk patients. This recommendation follows the consensus statement: Guidelines for Management of Incidental Pulmonary Nodules Detected on CT Images: From the Fleischner Society 2017; Radiology 2017; 284:228-243. 2. Trace bilateral pleural effusions. 3. Similar splenomegaly measures 15.2 cm. 4. Coronary artery calcifications. Assessment for potential risk factor modification, dietary therapy or pharmacologic therapy may be warranted, if clinically indicated. Electronically Signed   By: Agustin Cree M.D.   On: 07/12/2022 15:55   CT ABDOMEN PELVIS W CONTRAST  Result Date: 07/09/2022 CLINICAL DATA:  Anterior inferior abdominal pain for evaluation of pyelonephritis EXAM: CT ABDOMEN AND PELVIS WITH CONTRAST TECHNIQUE: Multidetector CT imaging of the abdomen and pelvis was performed using the standard protocol following bolus administration of intravenous contrast. RADIATION DOSE REDUCTION: This exam was performed according to the departmental dose-optimization program which includes automated exposure control, adjustment of the mA and/or kV according to  patient size  and/or use of iterative reconstruction technique. CONTRAST:  OMNIPAQUE IOHEXOL 300 MG/ML  SOLN COMPARISON:  None Available. FINDINGS: Lower chest: Left lower lobe ground-glass nodule measures 9 x 7 mm (5:29). Additional ill-defined ground-glass density on series 5, image 18. Additional scattered solid nodules measuring up to 3 mm in the right (5:17) and left lower lobes (5:8,9). Triangular right middle lobe perifissural nodule (5:8) is most often an intrapulmonary lymph node. No specific follow-up imaging recommended. No pleural effusion or pneumothorax demonstrated. Partially imaged heart size is normal. Hepatobiliary: Hypoattenuation along the falciform ligament may reflect focal steatosis or perfusional variation. No intra or extrahepatic biliary ductal dilation. Normal gallbladder. Pancreas: No focal lesions or main ductal dilation. Spleen: Enlarged in the AP dimension measures 14.9 cm. No focal lesions. Adrenals/Urinary Tract: Nodular thickening of the right adrenal gland without discrete nodule. Left adrenal nodule. Thick-walled hypoenhancing exophytic lesion along the anterolateral left interpolar kidney measures 1.4 x 1.0 cm (2:38). No hydronephrosis or stones. Circumferential mural thickening and pericystic stranding. Stomach/Bowel: Normal appearance of the stomach. No evidence of bowel wall thickening, distention, or inflammatory changes. Normal appendix. Vascular/Lymphatic: No significant vascular findings are present. No enlarged abdominal or pelvic lymph nodes. Reproductive: Multiloculated rim enhancing fluid collections within the prostate gland measuring up to 5.7 x 3.3 cm in conglomerate (2:90). Other: No free fluid, fluid collection, or free air. Musculoskeletal: No acute or abnormal lytic or blastic osseous lesions. IMPRESSION: 1. Multiloculated rim enhancing fluid collections within the prostate gland measuring up to 5.7 x 3.3 cm in conglomerate, suspicious for abscesses. Cystic  degeneration of BPH nodule and cystic neoplasm are considered much less likely in the setting of acute urinary tract infection. 2. Circumferential mural thickening and pericystic stranding, suggestive of cystitis. 3. Thick-walled hypoenhancing exophytic lesion along the anterolateral left interpolar kidney measures 1.4 x 1.0 cm, suspicious for hypoenhancing primary renal neoplasm. Recommend further evaluation with nonemergent renal mass protocol MRI or CT. 4. Left lower lobe ground-glass nodule measures 9 x 7 mm, likely infectious/inflammatory. Additional scattered solid nodules measuring up to 3 mm in the bilateral lower lobes. Non-contrast chest CT at 3-6 months is recommended. If nodules persist, subsequent management will be based upon the most suspicious nodule(s). This recommendation follows the consensus statement: Guidelines for Management of Incidental Pulmonary Nodules Detected on CT Images: From the Fleischner Society 2017; Radiology 2017; 284:228-243. 5. Splenomegaly. Electronically Signed   By: Agustin Cree M.D.   On: 07/09/2022 16:34   DG Chest 2 View  Result Date: 07/09/2022 CLINICAL DATA:  Five day history of shortness of breath EXAM: CHEST - 2 VIEW COMPARISON:  Chest radiograph dated 06/28/2015 FINDINGS: Low lung volumes. No focal consolidations. No pleural effusion or pneumothorax. The heart size and mediastinal contours are within normal limits. No acute osseous abnormality. IMPRESSION: No active cardiopulmonary disease. Electronically Signed   By: Agustin Cree M.D.   On: 07/09/2022 12:10    Microbiology: Results for orders placed or performed during the hospital encounter of 07/09/22  Urine Culture     Status: Abnormal   Collection Time: 07/09/22 12:38 PM   Specimen: Urine, Clean Catch  Result Value Ref Range Status   Specimen Description   Final    URINE, CLEAN CATCH Performed at [facility], [address], [place], [place] [address]    Special Requests   Final     NONE Performed at [facility], [address] [place], [place] [address]    Culture (A)  Final    >=100,000 COLONIES/mL METHICILLIN RESISTANT STAPHYLOCOCCUS AUREUS   Report Status 07/11/2022 FINAL  Final   Organism ID, Bacteria METHICILLIN RESISTANT STAPHYLOCOCCUS AUREUS (A)  Final      Susceptibility   Methicillin resistant staphylococcus aureus - MIC*    CIPROFLOXACIN >=8 RESISTANT Resistant     GENTAMICIN <=0.5 SENSITIVE Sensitive     NITROFURANTOIN <=16 SENSITIVE Sensitive     OXACILLIN >=4 RESISTANT Resistant     TETRACYCLINE <=1 SENSITIVE Sensitive     VANCOMYCIN <=0.5 SENSITIVE Sensitive     TRIMETH/SULFA >=320 RESISTANT Resistant     CLINDAMYCIN <=0.25 SENSITIVE Sensitive     RIFAMPIN <=0.5 SENSITIVE Sensitive     Inducible Clindamycin NEGATIVE Sensitive     LINEZOLID 2 SENSITIVE Sensitive     * >=100,000 COLONIES/mL METHICILLIN RESISTANT STAPHYLOCOCCUS AUREUS  Blood culture (routine x 2)     Status: None   Collection Time: 07/09/22  8:53 PM   Specimen: BLOOD  Result Value Ref Range Status   Specimen Description   Final    BLOOD BLOOD RIGHT ARM Performed at [facility], [address], [place], [place] [address]    Special Requests   Final    BOTTLES DRAWN AEROBIC ONLY Blood Culture adequate volume Performed at [facility], [address] [place], [place] [address]    Culture   Final    NO GROWTH 5 DAYS Performed at [facility], [address], [place], [place] [address]    Report Status 07/15/2022 FINAL  Final  Blood culture (routine x 2)     Status: None   Collection Time: 07/09/22  8:58 PM   Specimen: BLOOD  Result Value Ref Range Status   Specimen Description   Final    BLOOD BLOOD LEFT ARM Performed at [facility], [address] [place], [place] [address]    Special Requests   Final    BOTTLES DRAWN AEROBIC ONLY Blood Culture adequate volume Performed at [facility], [address] [place], [place] [address]    Culture   Final    NO GROWTH 5 DAYS Performed at [facility], [address], [place], [place] [address]    Report Status 07/15/2022 FINAL  Final  Urine Culture     Status: Abnormal   Collection Time: 07/10/22 10:01 AM   Specimen: Urine, Cystoscope  Result Value Ref Range Status   Specimen Description   Final    CYSTOSCOPY Performed at [facility], [address] [place], [place] [address]    Special Requests   Final    NONE Performed at [facility], [address] [place], [place] [address]    Culture (A)  Final    90,000 COLONIES/mL METHICILLIN RESISTANT STAPHYLOCOCCUS AUREUS   Report Status 07/12/2022 FINAL  Final   Organism ID, Bacteria METHICILLIN RESISTANT STAPHYLOCOCCUS AUREUS (A)  Final      Susceptibility   Methicillin resistant staphylococcus aureus - MIC*    CIPROFLOXACIN >=8 RESISTANT Resistant     ERYTHROMYCIN <=0.25 SENSITIVE Sensitive     GENTAMICIN <=0.5 SENSITIVE Sensitive     OXACILLIN >=4 RESISTANT Resistant     TETRACYCLINE <=1 SENSITIVE Sensitive     VANCOMYCIN <=0.5 SENSITIVE Sensitive     TRIMETH/SULFA >=320 RESISTANT Resistant     CLINDAMYCIN <=0.25 SENSITIVE Sensitive     RIFAMPIN <=0.5 SENSITIVE Sensitive     Inducible Clindamycin NEGATIVE Sensitive  LINEZOLID 2 SENSITIVE Sensitive     * 90,000 COLONIES/mL METHICILLIN RESISTANT STAPHYLOCOCCUS AUREUS    Labs: CBC: Recent Labs  Lab 07/12/22 0736 07/15/22 0456 07/16/22 0508 07/17/22 0548 07/18/22 0423  WBC 11.2* 9.7 9.7 10.2 10.3  HGB 11.8* 13.5 14.0 13.6 13.2  HCT 34.8* 40.2 41.5 40.8 40.1  MCV 92.1 92.2 92.2 93.8 93.3  PLT 255 416* 453* 414* 416*   Basic Metabolic Panel: Recent Labs  Lab 07/14/22 0453 07/15/22 0456 07/16/22 0508 07/17/22 0548 07/18/22 0423  NA 129* 124* 135 132* 132*  K 4.6 4.2 4.5 4.5 4.1  CL 93* 92* 96* 94* 96*  CO2 29 26 32 31 26  GLUCOSE  330* 333* 170* 211* 245*  BUN 11 12 13 13 12   CREATININE 0.72 0.69 0.60* 0.73 0.68  CALCIUM 8.3* 8.0* 9.2 8.9 8.8*  MG  --   --   --   --  1.8   Liver Function Tests: Recent Labs  Lab 07/16/22 0508 07/17/22 0548 07/18/22 0423  AST 18 22 20   ALT 13 16 16   ALKPHOS 54 55 58  BILITOT 0.6 0.4 0.6  PROT 7.0 6.9 7.2  ALBUMIN 2.8* 2.8* 3.1*   CBG: Recent Labs  Lab 07/17/22 2056 07/18/22 0255 07/18/22 0735 07/18/22 1137 07/18/22 1709  GLUCAP 181* 284* 158* 149* 277*    Discharge time spent: less than 30 minutes.  Signed: Rickey Barbara, MD [facility] 07/18/2022

## 2022-07-18 NOTE — Consult Note (Signed)
Reason for consult:  HPI: Alejandro Santiago is an 47 y.o. male with prostate abscess we are asked to see for a red, painful right eye.  The patient reports 4-5 day history of right eye redness.  He's had some crusting of the eye upon awakening.  Has achey pain in and behind OD.  The vision OD seems somewhat blurred.  No flashes or floaters.  No diplopia.  He has been treated with poly/bac ung.  No significant improvement.    No past ocular history.  No prior eye surgery.    Past Medical History:  Diagnosis Date   Hypertension    Past Surgical History:  Procedure Laterality Date   TRANSURETHRAL RESECTION OF PROSTATE N/A 07/10/2022   Procedure: TRANSURETHRAL RESECTION OF THE PROSTATE (TURP);  Surgeon: Jerilee Field, MD;  Location: [facility];  Service: Urology;  Laterality: N/A;   History reviewed. No pertinent family history. Current Facility-Administered Medications  Medication Dose Route Frequency Provider Last Rate Last Admin   acetaminophen (TYLENOL) tablet 650 mg  650 mg Oral Q6H PRN Chrystie Nose, MD   650 mg at 07/18/22 0813   Or   acetaminophen (TYLENOL) suppository 650 mg  650 mg Rectal Q6H PRN Chrystie Nose, MD       bisacodyl (DULCOLAX) EC tablet 5 mg  5 mg Oral Daily PRN Chrystie Nose, MD   5 mg at 07/13/22 0802   Chlorhexidine Gluconate Cloth 2 % PADS 6 each  6 each Topical Daily Chrystie Nose, MD   6 each at 07/17/22 0857   docusate sodium (COLACE) capsule 100 mg  100 mg Oral BID Chrystie Nose, MD   100 mg at 07/18/22 1034   doxycycline (VIBRA-TABS) tablet 100 mg  100 mg Oral Q12H Jerald Kief, MD   100 mg at 07/18/22 1354   enoxaparin (LOVENOX) injection 40 mg  40 mg Subcutaneous Q24H Chrystie Nose, MD   40 mg at 07/17/22 2146   feeding supplement (GLUCERNA SHAKE) (GLUCERNA SHAKE) liquid 237 mL  237 mL Oral TID BM Chrystie Nose, MD   237 mL at 07/18/22 1354   HYDROcodone-acetaminophen (NORCO) 7.5-325 MG per tablet 1 tablet  1 tablet Oral QHS Chrystie Nose, MD   1 tablet at 07/17/22 2145   HYDROcodone-acetaminophen (NORCO/VICODIN) 5-325 MG per tablet 1 tablet  1 tablet Oral Q4H PRN Chrystie Nose, MD   1 tablet at 07/18/22 1059   insulin aspart (novoLOG) injection 0-15 Units  0-15 Units Subcutaneous TID WC Chrystie Nose, MD   2 Units at 07/18/22 1259   insulin aspart protamine- aspart (NOVOLOG MIX 70/30) injection 20 Units  20 Units Subcutaneous BID WC Chrystie Nose, MD   20 Units at 07/18/22 0815   insulin starter kit- pen needles (English) 1 kit  1 kit Other Once [place], Lisette Abu, MD       ketoconazole (NIZORAL) 2 % cream   Topical Daily Chrystie Nose, MD   Given at 07/18/22 1036   lip balm (CARMEX) ointment   Topical PRN Chrystie Nose, MD       multivitamin with minerals tablet 1 tablet  1 tablet Oral Daily Hilty, Lisette Abu, MD   1 tablet at 07/18/22 1034   nicotine (NICODERM CQ - dosed in mg/24 hours) patch 14 mg  14 mg Transdermal Q24H Chrystie Nose, MD   14 mg at 07/09/22 2029   nystatin-triamcinolone (MYCOLOG II) cream   Topical BID Hilty,  Lisette Abu, MD   Given at 07/18/22 1036   ondansetron (ZOFRAN) tablet 4 mg  4 mg Oral Q6H PRN Chrystie Nose, MD       Or   ondansetron [facility]) injection 4 mg  4 mg Intravenous Q6H PRN Chrystie Nose, MD   4 mg at 07/10/22 1026   Oral care mouth rinse  15 mL Mouth Rinse PRN Hilty, Lisette Abu, MD       polyethylene glycol (MIRALAX / GLYCOLAX) packet 17 g  17 g Oral BID Chrystie Nose, MD   17 g at 07/17/22 2145   senna (SENOKOT) tablet 8.6 mg  1 tablet Oral Daily Chrystie Nose, MD   8.6 mg at 07/18/22 1034   trimethoprim-polymyxin b (POLYTRIM) ophthalmic solution 1 drop  1 drop Right Eye Q4H Vu, Trung T, MD   1 drop at 07/18/22 1355   vancomycin (VANCOREADY) IVPB 1250 mg/250 mL  1,250 mg Intravenous Q8H Chrystie Nose, MD 166.7 mL/hr at 07/18/22 1158 1,250 mg at 07/18/22 1158   No Known Allergies Social History   Socioeconomic History   Marital status: Single     Spouse name: Not on file   Number of children: Not on file   Years of education: Not on file   Highest education level: Not on file  Occupational History   Not on file  Tobacco Use   Smoking status: Every Day    Packs/day: .5    Types: Cigarettes   Smokeless tobacco: Never  Substance and Sexual Activity   Alcohol use: Yes    Comment: occasionally   Drug use: No   Sexual activity: Not on file  Other Topics Concern   Not on file  Social History Narrative   Not on file   Social Determinants of Health   Financial Resource Strain: Not on file  Food Insecurity: No Food Insecurity (07/09/2022)   Hunger Vital Sign    Worried About Running Out of Food in the Last Year: Never true    Ran Out of Food in the Last Year: Never true  Transportation Needs: No Transportation Needs (07/09/2022)   PRAPARE - Transportation    Lack of Transportation (Medical): No    Lack of Transportation (Non-Medical): No  Physical Activity: Not on file  Stress: Not on file  Social Connections: Not on file  Intimate Partner Violence: Not At Risk (07/09/2022)   Humiliation, Afraid, Rape, and Kick questionnaire    Fear of Current or Ex-Partner: No    Emotionally Abused: No    Physically Abused: No    Sexually Abused: No    Review of systems: Per progress notes reviewed.   Physical Exam:  Blood pressure (!) 149/91, pulse 85, temperature 97.9 F (36.6 C), temperature source Oral, resp. rate 17, height 6' (1.829 m), weight 81.4 kg, SpO2 100 %.   [facility] [place]   OD 20/60-  OS 20/40    Pupils:   OD round, reactive to light, no APD            OS round, reactive to light, no APD  IOP (T pen)  OD 11   OS  10  CVF: OD full to CF   OS full to CF  Motility:  OD full ductions  OS full ductions  Balance/alignment:  Ortho by Phoebe Perch   Bed side lighted and magnified examination:  OD                                       External/adnexa: Normal                                       Lids/lashes:        Normal                                      Conjunctiva        2+ hyperemia most prominent nasal, supero and inferonasal, some prominent episcleral vssls.        Cornea:              Clear                  AC:                     Deep, no hypopyon                                Iris:                     Normal        Lens:                  Clear                                       OS                                       External/adnexa: Normal                                      Lids/lashes:        Normal                                      Conjunctiva        White, quiet        Cornea:              Clear                  AC:                     Deep, quiet                                Iris:                     Normal        Lens:                  Clear  Dilated fundus exam: (Neo 2.5; Myd 1%)      OD Vitreous            Mostly clear, there is midl vitritis inferonasal                             Optic Disc:       Normal, perfused                      Macula:             Flat, single hemorrhage                                      Vessels:           Normal caliber,distribution         Periphery:         Flat, attached; there is a focal 1.5-2 DD whitened, round chorioretinal infiltrate inferonasal, 2 DBHs adjacent to it                                 OS Vitreous            Clear, quiet                                Optic Disc:       Normal, perfused                      Macula:             Flat                                            Vessels:           Normal caliber,distribution         Periphery:         Flat, attached        Labs/studies: Results for orders placed or performed during the hospital encounter of 07/09/22 (from the past 48 hour(s))  Glucose, capillary     Status: Abnormal   Collection Time: 07/16/22  6:15 PM  Result Value Ref Range   Glucose-Capillary 279 (H) 70 - 99 mg/dL    Comment: Glucose reference range applies only  to samples taken after fasting for at least 8 hours.  Glucose, capillary     Status: Abnormal   Collection Time: 07/16/22  8:19 PM  Result Value Ref Range   Glucose-Capillary 167 (H) 70 - 99 mg/dL    Comment: Glucose reference range applies only to samples taken after fasting for at least 8 hours.  Glucose, capillary     Status: Abnormal   Collection Time: 07/17/22  3:46 AM  Result Value Ref Range   Glucose-Capillary 251 (H) 70 - 99 mg/dL    Comment: Glucose reference range applies only to samples taken after fasting for at least 8 hours.  Comprehensive metabolic panel     Status: Abnormal   Collection Time: 07/17/22  5:48 AM  Result Value Ref Range   Sodium 132 (L) 135 - 145 mmol/L  Potassium 4.5 3.5 - 5.1 mmol/L   Chloride 94 (L) 98 - 111 mmol/L   CO2 31 22 - 32 mmol/L   Glucose, Bld 211 (H) 70 - 99 mg/dL    Comment: Glucose reference range applies only to samples taken after fasting for at least 8 hours.   BUN 13 6 - 20 mg/dL   Creatinine, Ser 1.61 0.61 - 1.24 mg/dL   Calcium 8.9 8.9 - 09.6 mg/dL   Total Protein 6.9 6.5 - 8.1 g/dL   Albumin 2.8 (L) 3.5 - 5.0 g/dL   AST 22 15 - 41 U/L   ALT 16 0 - 44 U/L   Alkaline Phosphatase 55 38 - 126 U/L   Total Bilirubin 0.4 0.3 - 1.2 mg/dL   GFR, Estimated >04 >54 mL/min    Comment: (NOTE) Calculated using the CKD-EPI Creatinine Equation (2021)    Anion gap 7 5 - 15    Comment: Performed at [facility], [address] [place], [place] [address]  CBC     Status: Abnormal   Collection Time: 07/17/22  5:48 AM  Result Value Ref Range   WBC 10.2 4.0 - 10.5 K/uL   RBC 4.35 4.22 - 5.81 MIL/uL   Hemoglobin 13.6 13.0 - 17.0 g/dL   HCT 91.4 78.2 - 95.6 %   MCV 93.8 80.0 - 100.0 fL   MCH 31.3 26.0 - 34.0 pg   MCHC 33.3 30.0 - 36.0 g/dL   RDW 21.3 08.6 - 57.8 %   Platelets 414 (H) 150 - 400 K/uL   nRBC 0.0 0.0 - 0.2 %    Comment: Performed at [facility], [address] [place], [place] [address]   Glucose, capillary     Status: Abnormal   Collection Time: 07/17/22  7:30 AM  Result Value Ref Range   Glucose-Capillary 214 (H) 70 - 99 mg/dL    Comment: Glucose reference range applies only to samples taken after fasting for at least 8 hours.  Glucose, capillary     Status: Abnormal   Collection Time: 07/17/22 12:12 PM  Result Value Ref Range   Glucose-Capillary 144 (H) 70 - 99 mg/dL    Comment: Glucose reference range applies only to samples taken after fasting for at least 8 hours.  Glucose, capillary     Status: Abnormal   Collection Time: 07/17/22  4:42 PM  Result Value Ref Range   Glucose-Capillary 245 (H) 70 - 99 mg/dL    Comment: Glucose reference range applies only to samples taken after fasting for at least 8 hours.  Glucose, capillary     Status: Abnormal   Collection Time: 07/17/22  8:56 PM  Result Value Ref Range   Glucose-Capillary 181 (H) 70 - 99 mg/dL    Comment: Glucose reference range applies only to samples taken after fasting for at least 8 hours.  Glucose, capillary     Status: Abnormal   Collection Time: 07/18/22  2:55 AM  Result Value Ref Range   Glucose-Capillary 284 (H) 70 - 99 mg/dL    Comment: Glucose reference range applies only to samples taken after fasting for at least 8 hours.  Comprehensive metabolic panel     Status: Abnormal   Collection Time: 07/18/22  4:23 AM  Result Value Ref Range   Sodium 132 (L) 135 - 145 mmol/L   Potassium 4.1 3.5 - 5.1 mmol/L   Chloride 96 (L) 98 - 111 mmol/L   CO2 26 22 - 32 mmol/L  Glucose, Bld 245 (H) 70 - 99 mg/dL    Comment: Glucose reference range applies only to samples taken after fasting for at least 8 hours.   BUN 12 6 - 20 mg/dL   Creatinine, Ser 1.61 0.61 - 1.24 mg/dL   Calcium 8.8 (L) 8.9 - 10.3 mg/dL   Total Protein 7.2 6.5 - 8.1 g/dL   Albumin 3.1 (L) 3.5 - 5.0 g/dL   AST 20 15 - 41 U/L   ALT 16 0 - 44 U/L   Alkaline Phosphatase 58 38 - 126 U/L   Total Bilirubin 0.6 0.3 - 1.2 mg/dL   GFR,  Estimated >09 >60 mL/min    Comment: (NOTE) Calculated using the CKD-EPI Creatinine Equation (2021)    Anion gap 10 5 - 15    Comment: Performed at [facility], [address] [place], [place] [address]  Magnesium     Status: None   Collection Time: 07/18/22  4:23 AM  Result Value Ref Range   Magnesium 1.8 1.7 - 2.4 mg/dL    Comment: Performed at [facility], [address], [place], [place] [address]  CBC     Status: Abnormal   Collection Time: 07/18/22  4:23 AM  Result Value Ref Range   WBC 10.3 4.0 - 10.5 K/uL   RBC 4.30 4.22 - 5.81 MIL/uL   Hemoglobin 13.2 13.0 - 17.0 g/dL   HCT 47.8 29.5 - 62.1 %   MCV 93.3 80.0 - 100.0 fL   MCH 30.7 26.0 - 34.0 pg   MCHC 32.9 30.0 - 36.0 g/dL   RDW 30.8 65.7 - 84.6 %   Platelets 416 (H) 150 - 400 K/uL   nRBC 0.0 0.0 - 0.2 %    Comment: Performed at [facility], [address] [place], [place] [address]  Glucose, capillary     Status: Abnormal   Collection Time: 07/18/22  7:35 AM  Result Value Ref Range   Glucose-Capillary 158 (H) 70 - 99 mg/dL    Comment: Glucose reference range applies only to samples taken after fasting for at least 8 hours.  Glucose, capillary     Status: Abnormal   Collection Time: 07/18/22 11:37 AM  Result Value Ref Range   Glucose-Capillary 149 (H) 70 - 99 mg/dL    Comment: Glucose reference range applies only to samples taken after fasting for at least 8 hours.   No results found.                           Assessment and Plan: Alejandro Santiago is an 47 y.o. male with prostate abscess we are asked to see for a red, painful right eye with:   -- Chorioretinitis OS, mild/early vitritis OS.   Recommend:    -- Transfer to [facility] chorioretinitis, vitritis (early endophthalmitis) OS.    He will need subspecialty care by the [facility] service at [facility]   I spoke with his primary team and relayed my recommendations.  I will remain available.    All of  the above information was relayed to the patient   Ophthalmic warning signs and symptoms were reviewed, and clear instructions for immediate phone contact and/or immediate return to the ED or clinic were provided should any of these signs or symptoms occur.  Follow up contact information was provided.  All questions were answered.   Antony Contras 07/18/2022, 5:03 PM  [facility] [phone]

## 2022-07-19 ENCOUNTER — Inpatient Hospital Stay: Payer: Self-pay | Admitting: Nurse Practitioner

## 2022-07-19 ENCOUNTER — Encounter (HOSPITAL_COMMUNITY): Payer: Self-pay | Admitting: Internal Medicine

## 2022-07-19 LAB — GC/CHLAMYDIA PROBE AMP (~~LOC~~) NOT AT ARMC
Chlamydia: NEGATIVE
Comment: NEGATIVE
Comment: NORMAL
Neisseria Gonorrhea: NEGATIVE
